# Patient Record
Sex: Male | Born: 1955 | Race: Black or African American | Hispanic: No | Marital: Single | Smoking: Former smoker
Health system: Southern US, Community
[De-identification: ages and names within clinical notes are randomized; demographics above are authoritative.]

## PROBLEM LIST (undated history)

## (undated) DIAGNOSIS — B182 Chronic viral hepatitis C: Secondary | ICD-10-CM

## (undated) DIAGNOSIS — I4891 Unspecified atrial fibrillation: Secondary | ICD-10-CM

## (undated) DIAGNOSIS — N183 Chronic kidney disease, stage 3 unspecified: Secondary | ICD-10-CM

## (undated) DIAGNOSIS — S065XAA Traumatic subdural hemorrhage with loss of consciousness status unknown, initial encounter: Secondary | ICD-10-CM

## (undated) DIAGNOSIS — I10 Essential (primary) hypertension: Secondary | ICD-10-CM

## (undated) DIAGNOSIS — S065X9A Traumatic subdural hemorrhage with loss of consciousness of unspecified duration, initial encounter: Secondary | ICD-10-CM

## (undated) DIAGNOSIS — E871 Hypo-osmolality and hyponatremia: Secondary | ICD-10-CM

## (undated) DIAGNOSIS — D649 Anemia, unspecified: Secondary | ICD-10-CM

## (undated) HISTORY — PX: NO PAST SURGERIES: SHX2092

---

## 2015-02-25 ENCOUNTER — Emergency Department (HOSPITAL_COMMUNITY)
Admission: EM | Admit: 2015-02-25 | Discharge: 2015-02-25 | Disposition: A | Discharge status: Home / Self Care | Payer: No Typology Code available for payment source | Source: Home / Self Care | Attending: Family Medicine | Admitting: Family Medicine

## 2015-02-25 ENCOUNTER — Encounter (HOSPITAL_COMMUNITY): Payer: Self-pay | Admitting: Emergency Medicine

## 2015-02-25 DIAGNOSIS — I160 Hypertensive urgency: Secondary | ICD-10-CM

## 2015-02-25 DIAGNOSIS — I10 Essential (primary) hypertension: Secondary | ICD-10-CM

## 2015-02-25 DIAGNOSIS — S46011A Strain of muscle(s) and tendon(s) of the rotator cuff of right shoulder, initial encounter: Secondary | ICD-10-CM

## 2015-02-25 LAB — POCT I-STAT, CHEM 8
BUN: 20 mg/dL (ref 6–23)
Calcium, Ion: 1.14 mmol/L (ref 1.13–1.30)
Chloride: 102 mmol/L (ref 96–112)
Creatinine, Ser: 1.1 mg/dL (ref 0.50–1.35)
Glucose, Bld: 96 mg/dL (ref 70–99)
HCT: 41 % (ref 39.0–52.0)
HEMOGLOBIN: 13.9 g/dL (ref 13.0–17.0)
Potassium: 4.5 mmol/L (ref 3.5–5.1)
Sodium: 140 mmol/L (ref 135–145)
TCO2: 26 mmol/L (ref 0–100)

## 2015-02-25 MED ORDER — CLONIDINE HCL 0.1 MG PO TABS
0.2000 mg | ORAL_TABLET | Freq: Once | ORAL | Status: AC
  Administered 2015-02-25: 0.2 mg via ORAL

## 2015-02-25 MED ORDER — CLONIDINE HCL 0.1 MG PO TABS
ORAL_TABLET | ORAL | Status: AC
  Filled 2015-02-25: qty 2

## 2015-02-25 MED ORDER — DICLOFENAC SODIUM 75 MG PO TBEC: 75.0000 mg | DELAYED_RELEASE_TABLET | Freq: Two times a day (BID) | ORAL | Status: DC

## 2015-02-25 MED ORDER — HYDROCHLOROTHIAZIDE 25 MG PO TABS: 25.0000 mg | ORAL_TABLET | Freq: Every day | ORAL | Status: DC

## 2015-02-25 MED ORDER — KETOROLAC TROMETHAMINE 60 MG/2ML IM SOLN
60.0000 mg | Freq: Once | INTRAMUSCULAR | Status: AC
  Administered 2015-02-25: 60 mg via INTRAMUSCULAR

## 2015-02-25 MED ORDER — KETOROLAC TROMETHAMINE 60 MG/2ML IM SOLN
INTRAMUSCULAR | Status: AC
  Filled 2015-02-25: qty 2

## 2015-02-25 NOTE — ED Notes (Addendum)
Right shoulder pain, no known injury.  Points to top/back of shoulder.  Pain with movement, worse with certain movements patient is right handed pain started in February.  Patient has used ice, heat and bengay

## 2015-02-25 NOTE — Discharge Instructions (Signed)
Your blood pressure is very elevated. Please start the new blood pressure medication and check your blood pressure daily. If you develop symptoms of low blood pressure including dizziness, lightheadedness, passing out please cut the medication in half or stop the medication completely. Please use the Voltaren twice daily for shoulder pain and do your shoulder exercises. Please return in 2 weeks for blood work and further management of your shoulder pain if not improving. These [facility] as you'll need to follow-up with them long-term. Please go to the emergency room if you have any further problems.   Shoulder Exercises EXERCISES  RANGE OF MOTION (ROM) AND STRETCHING EXERCISES These exercises may help you when beginning to rehabilitate your injury. Your symptoms may resolve with or without further involvement from your physician, physical therapist or athletic trainer. While completing these exercises, remember:   Restoring tissue flexibility helps normal motion to return to the joints. This allows healthier, less painful movement and activity.  An effective stretch should be held for at least 30 seconds.  A stretch should never be painful. You should only feel a gentle lengthening or release in the stretched tissue. ROM - Pendulum  Bend at the waist so that your right / left arm falls away from your body. Support yourself with your opposite hand on a solid surface, such as a table or a countertop.  Your right / left arm should be perpendicular to the ground. If it is not perpendicular, you need to lean over farther. Relax the muscles in your right / left arm and shoulder as much as possible.  Gently sway your hips and trunk so they move your right / left arm without any use of your right / left shoulder muscles.  Progress your movements so that your right / left arm moves side to side, then forward and backward, and finally, both clockwise  and counterclockwise.  Complete __________ repetitions in each direction. Many people use this exercise to relieve discomfort in their shoulder as well as to gain range of motion. Repeat __________ times. Complete this exercise __________ times per day. STRETCH - Flexion, Standing  Stand with good posture. With an underhand grip on your right / left hand and an overhand grip on the opposite hand, grasp a broomstick or cane so that your hands are a little more than shoulder-width apart.  Keeping your right / left elbow straight and shoulder muscles relaxed, push the stick with your opposite hand to raise your right / left arm in front of your body and then overhead. Raise your arm until you feel a stretch in your right / left shoulder, but before you have increased shoulder pain.  Try to avoid shrugging your right / left shoulder as your arm rises by keeping your shoulder blade tucked down and toward your mid-back spine. Hold __________ seconds.  Slowly return to the starting position. Repeat __________ times. Complete this exercise __________ times per day. STRETCH - Internal Rotation  Place your right / left hand behind your back, palm-up.  Throw a towel or belt over your opposite shoulder. Grasp the towel/belt with your right / left hand.  While keeping an upright posture, gently pull up on the towel/belt until you feel a stretch in the front of your right / left shoulder.  Avoid shrugging your right / left shoulder as your arm rises by keeping your shoulder blade tucked down and toward your mid-back spine.  Hold __________. Release  the stretch by lowering your opposite hand. Repeat __________ times. Complete this exercise __________ times per day. STRETCH - External Rotation and Abduction  Stagger your stance through a doorframe. It does not matter which foot is forward.  As instructed by your physician, physical therapist or athletic trainer, place your hands:  And forearms above  your head and on the door frame.  And forearms at head-height and on the door frame.  At elbow-height and on the door frame.  Keeping your head and chest upright and your stomach muscles tight to prevent over-extending your low-back, slowly shift your weight onto your front foot until you feel a stretch across your chest and/or in the front of your shoulders.  Hold __________ seconds. Shift your weight to your back foot to release the stretch. Repeat __________ times. Complete this stretch __________ times per day.  STRENGTHENING EXERCISES  These exercises may help you when beginning to rehabilitate your injury. They may resolve your symptoms with or without further involvement from your physician, physical therapist or athletic trainer. While completing these exercises, remember:   Muscles can gain both the endurance and the strength needed for everyday activities through controlled exercises.  Complete these exercises as instructed by your physician, physical therapist or athletic trainer. Progress the resistance and repetitions only as guided.  You may experience muscle soreness or fatigue, but the pain or discomfort you are trying to eliminate should never worsen during these exercises. If this pain does worsen, stop and make certain you are following the directions exactly. If the pain is still present after adjustments, discontinue the exercise until you can discuss the trouble with your clinician.  If advised by your physician, during your recovery, avoid activity or exercises which involve actions that place your right / left hand or elbow above your head or behind your back or head. These positions stress the tissues which are trying to heal. STRENGTH - Scapular Depression and Adduction  With good posture, sit on a firm chair. Supported your arms in front of you with pillows, arm rests or a table top. Have your elbows in line with the sides of your body.  Gently draw your shoulder  blades down and toward your mid-back spine. Gradually increase the tension without tensing the muscles along the top of your shoulders and the back of your neck.  Hold for __________ seconds. Slowly release the tension and relax your muscles completely before completing the next repetition.  After you have practiced this exercise, remove the arm support and complete it in standing as well as sitting. Repeat __________ times. Complete this exercise __________ times per day.  STRENGTH - External Rotators  Secure a rubber exercise band/tubing to a fixed object so that it is at the same height as your right / left elbow when you are standing or sitting on a firm surface.  Stand or sit so that the secured exercise band/tubing is at your side that is not injured.  Bend your elbow 90 degrees. Place a folded towel or small pillow under your right / left arm so that your elbow is a few inches away from your side.  Keeping the tension on the exercise band/tubing, pull it away from your body, as if pivoting on your elbow. Be sure to keep your body steady so that the movement is only coming from your shoulder rotating.  Hold __________ seconds. Release the tension in a controlled manner as you return to the starting position. Repeat __________ times. Complete this  exercise __________ times per day.  STRENGTH - Supraspinatus  Stand or sit with good posture. Grasp a __________ weight or an exercise band/tubing so that your hand is "thumbs-up," like when you shake hands.  Slowly lift your right / left hand from your thigh into the air, traveling about 30 degrees from straight out at your side. Lift your hand to shoulder height or as far as you can without increasing any shoulder pain. Initially, many people do not lift their hands above shoulder height.  Avoid shrugging your right / left shoulder as your arm rises by keeping your shoulder blade tucked down and toward your mid-back spine.  Hold for  __________ seconds. Control the descent of your hand as you slowly return to your starting position. Repeat __________ times. Complete this exercise __________ times per day.  STRENGTH - Shoulder Extensors  Secure a rubber exercise band/tubing so that it is at the height of your shoulders when you are either standing or sitting on a firm arm-less chair.  With a thumbs-up grip, grasp an end of the band/tubing in each hand. Straighten your elbows and lift your hands straight in front of you at shoulder height. Step back away from the secured end of band/tubing until it becomes tense.  Squeezing your shoulder blades together, pull your hands down to the sides of your thighs. Do not allow your hands to go behind you.  Hold for __________ seconds. Slowly ease the tension on the band/tubing as you reverse the directions and return to the starting position. Repeat __________ times. Complete this exercise __________ times per day.  STRENGTH - Scapular Retractors  Secure a rubber exercise band/tubing so that it is at the height of your shoulders when you are either standing or sitting on a firm arm-less chair.  With a palm-down grip, grasp an end of the band/tubing in each hand. Straighten your elbows and lift your hands straight in front of you at shoulder height. Step back away from the secured end of band/tubing until it becomes tense.  Squeezing your shoulder blades together, draw your elbows back as you bend them. Keep your upper arm lifted away from your body throughout the exercise.  Hold __________ seconds. Slowly ease the tension on the band/tubing as you reverse the directions and return to the starting position. Repeat __________ times. Complete this exercise __________ times per day. STRENGTH - Scapular Depressors  Find a sturdy chair without wheels, such as a from a dining room table.  Keeping your feet on the floor, lift your bottom from the seat and lock your elbows.  Keeping your  elbows straight, allow gravity to pull your body weight down. Your shoulders will rise toward your ears.  Raise your body against gravity by drawing your shoulder blades down your back, shortening the distance between your shoulders and ears. Although your feet should always maintain contact with the floor, your feet should progressively support less body weight as you get stronger.  Hold __________ seconds. In a controlled and slow manner, lower your body weight to begin the next repetition. Repeat __________ times. Complete this exercise __________ times per day.  Document Released: 10/11/2005 Document Revised: 02/19/2012 Document Reviewed: 03/11/2009 [facility] Patient Information 2015 [place], [place]. This information is not intended to replace advice given to you by your health care provider. Make sure you discuss any questions you have with your health care provider.

## 2015-02-25 NOTE — ED Provider Notes (Signed)
CSN: [id number]     Arrival date & time 02/25/15  1208 History   First MD Initiated Contact with Patient 02/25/15 1329     Chief Complaint  Patient presents with  . Shoulder Pain   (Consider location/radiation/quality/duration/timing/severity/associated sxs/prior Treatment) HPI  Pt has not been to the doctor in 4 years.   R should pain. Started 3 weeks ago. Icy hot w/o benefit. Woke up one morning in pain. Increased physical demand the day prior. Has had shoulder pain before. Worse w/ certain movements above the head. No acute injury. Slightly improving. Pain is constant.   History reviewed. No pertinent past medical history. History reviewed. No pertinent past surgical history. Family History  Problem Relation Age of Onset  . Cancer Mother   . Heart failure Father   . Diabetes Brother    History  Substance Use Topics  . Smoking status: Current Every Day Smoker -- 1.00 packs/day  . Smokeless tobacco: Not on file  . Alcohol Use: Yes    Review of Systems Per HPI with all other pertinent systems negative.   Allergies  Review of patient's allergies indicates no known allergies.  Home Medications   Prior to Admission medications   Medication Sig Start Date End Date Taking? Authorizing Provider  acetaminophen (TYLENOL) 325 MG tablet Take 650 mg by mouth every 6 (six) hours as needed.   Yes Historical Provider, MD  Menthol, Topical Analgesic, (BENGAY EX) Apply topically.   Yes Historical Provider, MD  diclofenac (VOLTAREN) 75 MG EC tablet Take 1 tablet (75 mg total) by mouth 2 (two) times daily. 02/25/15   Ozella Rocksavid J Lamoyne Palencia, MD  hydrochlorothiazide (HYDRODIURIL) 25 MG tablet Take 1 tablet (25 mg total) by mouth daily. 02/25/15   Ozella Rocksavid J Johnmark Geiger, MD   BP 210/115 mmHg  Pulse 110  Temp(Src) 98.6 F (37 C) (Oral)  Resp 16  SpO2 98% Physical Exam Physical Exam  Constitutional: oriented to person, place, and time. appears well-developed and well-nourished. No distress.  HENT:   Head: Normocephalic and atraumatic.  Eyes: EOMI. PERRL.  Neck: Normal range of motion.  Cardiovascular: RRR, no m/r/g, 2+ distal pulses,  Pulmonary/Chest: Effort normal and breath sounds normal. No respiratory distress.  Abdominal: Soft. Bowel sounds are normal. NonTTP, no distension.  Musculoskeletal: Left arm full range of motion, nontender to palpation, right shoulder with abduction limited to 100, and some limitation to external rotation. Hawkins positive on right, empty can normal by the back liftoff normal, patient able to place hand behind head with great difficulty and pain., Cross body with pain. No bony abnormality or effusion noted.  Neurological: alert and oriented to person, place, and time.  Skin: Skin is warm. No rash noted. non diaphoretic.  Psychiatric: normal mood and affect. behavior is normal. Judgment and thought content normal.   ED Course  Procedures (including critical care time) Labs Review Labs Reviewed  HEMOGLOBIN A1C  POCT I-STAT, CHEM 8    Imaging Review No results found.   MDM   1. Hypertensive urgency   2. Rotator cuff strain, right, initial encounter      Chem 8 showing normal renal function and metabolic panel. Hemoglobin normal.  Clonidine 0.2 mg oral Blood much improved  Toradol 60 mg IM given  Of note patient has just obtained insurance and [facility]. He was instructed by the health and one in the center to come see us until he is established care in their  clinic. Since the patient has significant medical conditions with which cannot wait for treatment until then we will treat his blood pressure and shoulder pain until that time.  Start HCTZ 25 mg. Discussed signs and symptoms of hypotension. Patient to decrease dose or stop medication completely developed symptoms of hypotension.  Voltaren and shoulder exercises. If no improvement in 2-4 weeks consider imaging and/or a steroid  injection.  Precautions given and all questions answered  Shelly Flatten, MD Family Medicine 02/25/2015, 2:31 PM    Ozella Rocks, MD 02/25/15 [phone]

## 2015-02-26 LAB — HEMOGLOBIN A1C
Hgb A1c MFr Bld: 5.6 % (ref 4.8–5.6)
Mean Plasma Glucose: 114 mg/dL

## 2015-03-12 ENCOUNTER — Encounter (HOSPITAL_COMMUNITY): Payer: Self-pay

## 2015-03-12 ENCOUNTER — Emergency Department (HOSPITAL_COMMUNITY)
Admission: EM | Admit: 2015-03-12 | Discharge: 2015-03-12 | Disposition: A | Discharge status: Home / Self Care | Payer: No Typology Code available for payment source | Source: Home / Self Care | Attending: Family Medicine | Admitting: Family Medicine

## 2015-03-12 ENCOUNTER — Emergency Department (INDEPENDENT_AMBULATORY_CARE_PROVIDER_SITE_OTHER): Payer: No Typology Code available for payment source

## 2015-03-12 DIAGNOSIS — M25511 Pain in right shoulder: Secondary | ICD-10-CM

## 2015-03-12 DIAGNOSIS — I10 Essential (primary) hypertension: Secondary | ICD-10-CM

## 2015-03-12 DIAGNOSIS — M25519 Pain in unspecified shoulder: Secondary | ICD-10-CM

## 2015-03-12 LAB — POCT I-STAT, CHEM 8
BUN: 17 mg/dL (ref 6–23)
CHLORIDE: 99 mmol/L (ref 96–112)
CREATININE: 1.1 mg/dL (ref 0.50–1.35)
Calcium, Ion: 1.16 mmol/L (ref 1.13–1.30)
GLUCOSE: 105 mg/dL — AB (ref 70–99)
HCT: 42 % (ref 39.0–52.0)
Hemoglobin: 14.3 g/dL (ref 13.0–17.0)
Potassium: 3.8 mmol/L (ref 3.5–5.1)
SODIUM: 142 mmol/L (ref 135–145)
TCO2: 27 mmol/L (ref 0–100)

## 2015-03-12 MED ORDER — METHYLPREDNISOLONE ACETATE 40 MG/ML IJ SUSP
40.0000 mg | Freq: Once | INTRAMUSCULAR | Status: AC
  Administered 2015-03-12: 40 mg via INTRA_ARTICULAR

## 2015-03-12 MED ORDER — METHYLPREDNISOLONE ACETATE 40 MG/ML IJ SUSP
INTRAMUSCULAR | Status: AC
  Filled 2015-03-12: qty 1

## 2015-03-12 MED ORDER — HYDROCHLOROTHIAZIDE 25 MG PO TABS: 25.0000 mg | ORAL_TABLET | Freq: Every day | ORAL | Status: DC

## 2015-03-12 MED ORDER — DICLOFENAC SODIUM 75 MG PO TBEC: 75.0000 mg | DELAYED_RELEASE_TABLET | Freq: Two times a day (BID) | ORAL | Status: DC

## 2015-03-12 MED ORDER — LIDOCAINE HCL (PF) 2 % IJ SOLN
INTRAMUSCULAR | Status: AC
  Filled 2015-03-12: qty 2

## 2015-03-12 NOTE — ED Notes (Signed)
C/o continues to have painin shoulder, BP still up.out of BP pills

## 2015-03-12 NOTE — Discharge Instructions (Signed)
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 The steroid injection should work well to improve this pain. Please continue your exercises. Please call and set up a follow up appointment at the sports medicine clinic in 4 weeks if not better.  Please take the blood pressure medication as prescribed. Please keep your appointment at the new PCPs office in May.

## 2015-03-12 NOTE — ED Provider Notes (Signed)
CSN: [id number]     Arrival date & time 03/12/15  0801 History   First MD Initiated Contact with Patient 03/12/15 0813     Chief Complaint  Patient presents with  . Hypertension  . Shoulder Pain   (Consider location/radiation/quality/duration/timing/severity/associated sxs/prior Treatment) HPI  [facility].   Shoulder pain: Initially patient started on the Voltaren and shoulder exercises which he denies prescribed. States that the shoulder pain started to become somewhat improved but slept on his right side which worsened his shoulder pain and has not been able to get a better sense. Pain with certain movements. Pain is constant. Some improvement with the Voltaren and the exercises. I see how without relief. All this started actually 5 weeks ago during an increase in the patient's physical demand on his arms. Denies radiation of pain, decreased sensation, decreased strength  Hypertension. Patient reports actively spilling out his medicine. Has not had any blood pressure medicine since yesterday. Denies chest pain, palpitations, shortness of breath, headaches.  History reviewed. No pertinent past medical history. History reviewed. No pertinent past surgical history. Family History  Problem Relation Age of Onset  . Cancer Mother   . Heart failure Father   . Diabetes Brother    History  Substance Use Topics  . Smoking status: Current Every Day Smoker -- 1.00 packs/day  . Smokeless tobacco: Not on file  . Alcohol Use: Yes    Review of Systems Per HPI with all other pertinent systems negative.   Allergies  Review of patient's allergies indicates no known allergies.  Home Medications   Prior to Admission medications   Medication Sig Start Date End Date Taking? Authorizing Provider  acetaminophen (TYLENOL) 325 MG tablet Take 650 mg by mouth every 6 (six) hours as needed.    Historical Provider, MD  diclofenac (VOLTAREN) 75 MG EC tablet Take 1  tablet (75 mg total) by mouth 2 (two) times daily. 03/12/15   Ozella Rocksavid J Zayanna Pundt, MD  hydrochlorothiazide (HYDRODIURIL) 25 MG tablet Take 1 tablet (25 mg total) by mouth daily. 03/12/15   Ozella Rocksavid J Duron Meister, MD  Menthol, Topical Analgesic, (BENGAY EX) Apply topically.    Historical Provider, MD   BP 172/92 mmHg  Pulse 106  Temp(Src) 98.2 F (36.8 C) (Oral)  Resp 16  SpO2 98% Physical Exam Physical Exam  Constitutional: oriented to person, place, and time. appears well-developed and well-nourished. No distress.  HENT:  Head: Normocephalic and atraumatic.  Eyes: EOMI. PERRL.  Neck: Normal range of motion.  Cardiovascular: RRR, no m/r/g, 2+ distal pulses,  Pulmonary/Chest: Effort normal and breath sounds normal. No respiratory distress.  Abdominal: Soft. Bowel sounds are normal. NonTTP, no distension.  Musculoskeletal: Left arm full range of motion, nontender to palpation. Right shoulder with abduction limited to 100, and some limitation to external rotation. Hawkins positive on right, empty can normal, liftoff normal, patient able to place hand behind head with some difficulty., Cross body with pain. No bony abnormality or effusion noted.  Neurological: alert and oriented to person, place, and time.  Neurological: alert and oriented to person, place, and time.  Skin: Skin is warm. No rash noted. non diaphoretic.  Psychiatric: normal mood and affect. behavior is normal. Judgment and thought content normal.   ED Course  Injection of joint Date/Time: 03/12/2015 8:53 AM Performed by: Konrad DoloresMERRELL, Ysabel Cowgill J Authorized by: Konrad DoloresMERRELL, Zayveon Raschke J Consent: Verbal consent obtained. Consent given by: patient Patient identity confirmed: verbally with patient Preparation:  Patient was prepped and draped in the usual sterile fashion. Local anesthesia used: yes Local anesthetic: lidocaine 2% without epinephrine Anesthetic total: 3 ml Patient sedated: no Patient tolerance: Patient tolerated the procedure well with no  immediate complications Comments:  Depomedrol used. After using ethyl chloride spray to provide superficial anesthesia a posterior approach was used to obtain access to the glenohumeral space and the medication was injected.    (including critical care time) Labs Review Labs Reviewed  POCT I-STAT, CHEM 8 - Abnormal; Notable for the following:    Glucose, Bld 105 (*)    All other components within normal limits    Imaging Review Dg Shoulder Right  03/12/2015   CLINICAL DATA:  Right shoulder pain since February 2016. No known injury.  EXAM: RIGHT SHOULDER - 2+ VIEW  COMPARISON:  None.  FINDINGS: There is no evidence of fracture or dislocation. There is no evidence of arthropathy or other focal bone abnormality. Soft tissues are unremarkable.  Multiple small blebs in the right lung apex.  IMPRESSION: Normal right shoulder.   Electronically Signed   By: Francene Boyers M.D.   On: 03/12/2015 08:39     MDM   1. Essential hypertension   2. Shoulder pain      [place] pain: xray showing normal shoulder without significant arthritis, or acute abnormality. Anticipate his pain is in part from rotator cuff tendinitis, bursitis, and potentially mild arthritis.. Minimal improvement on Voltaren and exercises with acute worsening again after stressing it. Patient given 40 mg Depo-Medrol 2 mL 1% lidocaine injection. Initially improved. Patient to continue exercises and to follow-up at sports medicine clinic if not improved. [place] pain improved w/in minutes of injection.   Patient hypertensive today on exam but much improved over previous visit to the urgent care. Will refill his hydrochlorothiazide but counseled patient that he must see his primary care physician. He has an appointment set for May 25 to establish care with a new PCP. We will provide her with enough medication to get him to that appointment. Chem-8 today shows normal chemistry and renal function.     Ozella Rocks, MD 03/12/15  [phone]

## 2017-05-24 ENCOUNTER — Encounter (HOSPITAL_COMMUNITY): Payer: Self-pay | Admitting: Emergency Medicine

## 2017-05-24 ENCOUNTER — Inpatient Hospital Stay (HOSPITAL_COMMUNITY): Payer: Self-pay

## 2017-05-24 ENCOUNTER — Inpatient Hospital Stay (HOSPITAL_COMMUNITY)
Admission: EM | Admit: 2017-05-24 | Discharge: 2017-05-25 | DRG: 064 | Disposition: A | Discharge status: Home / Self Care | Payer: Self-pay | Attending: Internal Medicine | Admitting: Internal Medicine

## 2017-05-24 ENCOUNTER — Other Ambulatory Visit (HOSPITAL_COMMUNITY): Payer: Self-pay

## 2017-05-24 ENCOUNTER — Emergency Department (HOSPITAL_COMMUNITY): Payer: Self-pay

## 2017-05-24 DIAGNOSIS — Z681 Body mass index (BMI) 19 or less, adult: Secondary | ICD-10-CM

## 2017-05-24 DIAGNOSIS — T148XXA Other injury of unspecified body region, initial encounter: Secondary | ICD-10-CM

## 2017-05-24 DIAGNOSIS — R402252 Coma scale, best verbal response, oriented, at arrival to emergency department: Secondary | ICD-10-CM | POA: Diagnosis present

## 2017-05-24 DIAGNOSIS — R402142 Coma scale, eyes open, spontaneous, at arrival to emergency department: Secondary | ICD-10-CM | POA: Diagnosis present

## 2017-05-24 DIAGNOSIS — Z8249 Family history of ischemic heart disease and other diseases of the circulatory system: Secondary | ICD-10-CM

## 2017-05-24 DIAGNOSIS — I629 Nontraumatic intracranial hemorrhage, unspecified: Principal | ICD-10-CM

## 2017-05-24 DIAGNOSIS — S0689AA Other specified intracranial injury with loss of consciousness status unknown, initial encounter: Secondary | ICD-10-CM

## 2017-05-24 DIAGNOSIS — I480 Paroxysmal atrial fibrillation: Secondary | ICD-10-CM | POA: Diagnosis present

## 2017-05-24 DIAGNOSIS — Z833 Family history of diabetes mellitus: Secondary | ICD-10-CM

## 2017-05-24 DIAGNOSIS — J9601 Acute respiratory failure with hypoxia: Secondary | ICD-10-CM | POA: Diagnosis present

## 2017-05-24 DIAGNOSIS — D649 Anemia, unspecified: Secondary | ICD-10-CM | POA: Diagnosis present

## 2017-05-24 DIAGNOSIS — E871 Hypo-osmolality and hyponatremia: Secondary | ICD-10-CM | POA: Diagnosis present

## 2017-05-24 DIAGNOSIS — F17210 Nicotine dependence, cigarettes, uncomplicated: Secondary | ICD-10-CM | POA: Diagnosis present

## 2017-05-24 DIAGNOSIS — S06369A Traumatic hemorrhage of cerebrum, unspecified, with loss of consciousness of unspecified duration, initial encounter: Secondary | ICD-10-CM

## 2017-05-24 DIAGNOSIS — I10 Essential (primary) hypertension: Secondary | ICD-10-CM | POA: Diagnosis present

## 2017-05-24 DIAGNOSIS — E46 Unspecified protein-calorie malnutrition: Secondary | ICD-10-CM | POA: Diagnosis present

## 2017-05-24 DIAGNOSIS — R001 Bradycardia, unspecified: Secondary | ICD-10-CM | POA: Diagnosis present

## 2017-05-24 DIAGNOSIS — B1920 Unspecified viral hepatitis C without hepatic coma: Secondary | ICD-10-CM | POA: Diagnosis present

## 2017-05-24 DIAGNOSIS — W19XXXA Unspecified fall, initial encounter: Secondary | ICD-10-CM

## 2017-05-24 DIAGNOSIS — R7989 Other specified abnormal findings of blood chemistry: Secondary | ICD-10-CM

## 2017-05-24 DIAGNOSIS — N179 Acute kidney failure, unspecified: Secondary | ICD-10-CM | POA: Diagnosis present

## 2017-05-24 DIAGNOSIS — B182 Chronic viral hepatitis C: Secondary | ICD-10-CM | POA: Diagnosis present

## 2017-05-24 DIAGNOSIS — S06350A Traumatic hemorrhage of left cerebrum without loss of consciousness, initial encounter: Secondary | ICD-10-CM

## 2017-05-24 DIAGNOSIS — Z809 Family history of malignant neoplasm, unspecified: Secondary | ICD-10-CM

## 2017-05-24 DIAGNOSIS — I959 Hypotension, unspecified: Secondary | ICD-10-CM | POA: Diagnosis present

## 2017-05-24 DIAGNOSIS — R402362 Coma scale, best motor response, obeys commands, at arrival to emergency department: Secondary | ICD-10-CM | POA: Diagnosis present

## 2017-05-24 HISTORY — DX: Unspecified atrial fibrillation: I48.91

## 2017-05-24 HISTORY — DX: Essential (primary) hypertension: I10

## 2017-05-24 LAB — BASIC METABOLIC PANEL
ANION GAP: 14 (ref 5–15)
ANION GAP: 9 (ref 5–15)
BUN: 15 mg/dL (ref 6–20)
BUN: 17 mg/dL (ref 6–20)
CHLORIDE: 97 mmol/L — AB (ref 101–111)
CHLORIDE: 102 mmol/L (ref 101–111)
CO2: 16 mmol/L — ABNORMAL LOW (ref 22–32)
CO2: 21 mmol/L — AB (ref 22–32)
Calcium: 8.1 mg/dL — ABNORMAL LOW (ref 8.9–10.3)
Calcium: 8.6 mg/dL — ABNORMAL LOW (ref 8.9–10.3)
Creatinine, Ser: 2.41 mg/dL — ABNORMAL HIGH (ref 0.61–1.24)
Creatinine, Ser: 1.93 mg/dL — ABNORMAL HIGH (ref 0.61–1.24)
GFR calc Af Amer: 31 mL/min — ABNORMAL LOW (ref >60)
GFR calc Af Amer: 41 mL/min — ABNORMAL LOW (ref >60)
GFR, EST NON AFRICAN AMERICAN: 27 mL/min — AB (ref >60)
GFR, EST NON AFRICAN AMERICAN: 36 mL/min — AB (ref >60)
GLUCOSE: 80 mg/dL (ref 65–99)
Glucose, Bld: 136 mg/dL — ABNORMAL HIGH (ref 65–99)
Potassium: 4.3 mmol/L (ref 3.5–5.1)
Potassium: 4.3 mmol/L (ref 3.5–5.1)
SODIUM: 127 mmol/L — AB (ref 135–145)
Sodium: 132 mmol/L — ABNORMAL LOW (ref 135–145)

## 2017-05-24 LAB — HEPATIC FUNCTION PANEL
ALBUMIN: 2.5 g/dL — AB (ref 3.5–5.0)
ALT: 45 U/L (ref 17–63)
AST: 195 U/L — AB (ref 15–41)
Alkaline Phosphatase: 104 U/L (ref 38–126)
BILIRUBIN DIRECT: 0.4 mg/dL (ref 0.1–0.5)
BILIRUBIN TOTAL: 1 mg/dL (ref 0.3–1.2)
Indirect Bilirubin: 0.6 mg/dL (ref 0.3–0.9)
Total Protein: 8.1 g/dL (ref 6.5–8.1)

## 2017-05-24 LAB — URINALYSIS, ROUTINE W REFLEX MICROSCOPIC
Bilirubin Urine: NEGATIVE
GLUCOSE, UA: NEGATIVE mg/dL
Ketones, ur: NEGATIVE mg/dL
Leukocytes, UA: NEGATIVE
NITRITE: NEGATIVE
Protein, ur: 100 mg/dL — AB
Specific Gravity, Urine: 1.009 (ref 1.005–1.030)
pH: 5 (ref 5.0–8.0)

## 2017-05-24 LAB — I-STAT TROPONIN, ED
TROPONIN I, POC: 0.09 ng/mL — AB (ref 0.00–0.08)
Troponin i, poc: 0.08 ng/mL (ref 0.00–0.08)

## 2017-05-24 LAB — LIPID PANEL
CHOL/HDL RATIO: 2.5 ratio
Cholesterol: 127 mg/dL (ref 0–200)
HDL: 50 mg/dL (ref >40)
LDL CALC: 63 mg/dL (ref 0–99)
Triglycerides: 69 mg/dL (ref <150)
VLDL: 14 mg/dL (ref 0–40)

## 2017-05-24 LAB — RAPID URINE DRUG SCREEN, HOSP PERFORMED
Amphetamines: NOT DETECTED
Barbiturates: NOT DETECTED
Benzodiazepines: NOT DETECTED
COCAINE: POSITIVE — AB
OPIATES: NOT DETECTED
TETRAHYDROCANNABINOL: NOT DETECTED

## 2017-05-24 LAB — CBC
HEMATOCRIT: 28.7 % — AB (ref 39.0–52.0)
Hemoglobin: 9.3 g/dL — ABNORMAL LOW (ref 13.0–17.0)
MCH: 28.4 pg (ref 26.0–34.0)
MCHC: 32.4 g/dL (ref 30.0–36.0)
MCV: 87.8 fL (ref 78.0–100.0)
Platelets: 337 10*3/uL (ref 150–400)
RBC: 3.27 MIL/uL — AB (ref 4.22–5.81)
RDW: 16.6 % — AB (ref 11.5–15.5)
WBC: 8.9 10*3/uL (ref 4.0–10.5)

## 2017-05-24 LAB — VITAMIN B12: Vitamin B-12: 242 pg/mL (ref 180–914)

## 2017-05-24 LAB — TROPONIN I
TROPONIN I: 0.08 ng/mL — AB (ref <0.03)
TROPONIN I: 0.09 ng/mL — AB (ref <0.03)
Troponin I: 0.09 ng/mL (ref <0.03)

## 2017-05-24 LAB — OSMOLALITY: Osmolality: 281 mOsm/kg (ref 275–295)

## 2017-05-24 LAB — RETICULOCYTES
RBC.: 3.68 MIL/uL — AB (ref 4.22–5.81)
RETIC COUNT ABSOLUTE: 69.9 10*3/uL (ref 19.0–186.0)
RETIC CT PCT: 1.9 % (ref 0.4–3.1)

## 2017-05-24 LAB — BETA-HYDROXYBUTYRIC ACID: BETA-HYDROXYBUTYRIC ACID: 0.62 mmol/L — AB (ref 0.05–0.27)

## 2017-05-24 LAB — FOLATE: Folate: 15.4 ng/mL (ref >5.9)

## 2017-05-24 LAB — LIPASE, BLOOD: Lipase: 29 U/L (ref 11–51)

## 2017-05-24 LAB — HIV ANTIBODY (ROUTINE TESTING W REFLEX): HIV Screen 4th Generation wRfx: NONREACTIVE

## 2017-05-24 LAB — PROTIME-INR
INR: 1.06
PROTHROMBIN TIME: 13.8 s (ref 11.4–15.2)

## 2017-05-24 LAB — FERRITIN: Ferritin: 609 ng/mL — ABNORMAL HIGH (ref 24–336)

## 2017-05-24 LAB — OSMOLALITY, URINE: OSMOLALITY UR: 206 mosm/kg — AB (ref 300–900)

## 2017-05-24 LAB — CK: CK TOTAL: 343 U/L (ref 49–397)

## 2017-05-24 LAB — LACTIC ACID, PLASMA: LACTIC ACID, VENOUS: 1.6 mmol/L (ref 0.5–1.9)

## 2017-05-24 LAB — PATHOLOGIST SMEAR REVIEW

## 2017-05-24 LAB — CBG MONITORING, ED: Glucose-Capillary: 86 mg/dL (ref 65–99)

## 2017-05-24 LAB — ETHANOL

## 2017-05-24 MED ORDER — ASPIRIN 81 MG PO CHEW: 81.0000 mg | CHEWABLE_TABLET | Freq: Once | ORAL | Status: DC

## 2017-05-24 MED ORDER — ONDANSETRON HCL 4 MG/2ML IJ SOLN
4.0000 mg | Freq: Once | INTRAMUSCULAR | Status: DC
  Filled 2017-05-24: qty 2

## 2017-05-24 MED ORDER — VITAMIN B-1 100 MG PO TABS
100.0000 mg | ORAL_TABLET | Freq: Every day | ORAL | Status: DC
  Administered 2017-05-24 – 2017-05-25 (×2): 100 mg via ORAL
  Filled 2017-05-24 (×2): qty 1

## 2017-05-24 MED ORDER — LEVETIRACETAM 500 MG PO TABS
500.0000 mg | ORAL_TABLET | Freq: Two times a day (BID) | ORAL | Status: DC
  Administered 2017-05-24 – 2017-05-25 (×3): 500 mg via ORAL
  Filled 2017-05-24 (×3): qty 1

## 2017-05-24 MED ORDER — METOPROLOL SUCCINATE ER 100 MG PO TB24: 100.0000 mg | ORAL_TABLET | Freq: Every day | ORAL | Status: DC

## 2017-05-24 MED ORDER — GADOBENATE DIMEGLUMINE 529 MG/ML IV SOLN
6.0000 mL | Freq: Once | INTRAVENOUS | Status: AC
  Administered 2017-05-24: 6 mL via INTRAVENOUS

## 2017-05-24 MED ORDER — SODIUM CHLORIDE 0.9 % IV SOLN: INTRAVENOUS | Status: AC

## 2017-05-24 MED ORDER — LORAZEPAM 1 MG PO TABS: 1.0000 mg | ORAL_TABLET | Freq: Four times a day (QID) | ORAL | Status: DC | PRN

## 2017-05-24 MED ORDER — SODIUM CHLORIDE 0.9 % IV BOLUS (SEPSIS)
500.0000 mL | Freq: Once | INTRAVENOUS | Status: AC
Start: 2017-05-24 — End: 2017-05-24
  Administered 2017-05-24: 500 mL via INTRAVENOUS

## 2017-05-24 MED ORDER — LORAZEPAM 2 MG/ML IJ SOLN: 1.0000 mg | Freq: Four times a day (QID) | INTRAMUSCULAR | Status: DC | PRN

## 2017-05-24 MED ORDER — SODIUM CHLORIDE 0.9 % IV BOLUS (SEPSIS): 500.0000 mL | Freq: Once | INTRAVENOUS | Status: DC

## 2017-05-24 MED ORDER — FOLIC ACID 1 MG PO TABS
1.0000 mg | ORAL_TABLET | Freq: Every day | ORAL | Status: DC
  Administered 2017-05-24 – 2017-05-25 (×2): 1 mg via ORAL
  Filled 2017-05-24 (×2): qty 1

## 2017-05-24 MED ORDER — ADULT MULTIVITAMIN W/MINERALS CH
1.0000 | ORAL_TABLET | Freq: Every day | ORAL | Status: DC
  Administered 2017-05-24 – 2017-05-25 (×2): 1 via ORAL
  Filled 2017-05-24 (×2): qty 1

## 2017-05-24 MED ORDER — THIAMINE HCL 100 MG/ML IJ SOLN: 100.0000 mg | Freq: Every day | INTRAMUSCULAR | Status: DC

## 2017-05-24 NOTE — ED Notes (Signed)
Patient transported to MRI 

## 2017-05-24 NOTE — ED Notes (Signed)
Report called  

## 2017-05-24 NOTE — H&P (Signed)
Date: 05/24/2017               Patient Name:  Joseph Nash MRN: [id number]  DOB: 1956/06/02 Age / Sex: 61 y.o., male   PCP: Patient, No Pcp Per         Medical Service: Internal Medicine Teaching Service         Attending Physician: Dr. Gust Rung, DO    First Contact: Dr. Obie Dredge Pager: [phone]  Second Contact: Dr. Earlene Plater Pager: [phone]       After Hours (After 5p/  First Contact Pager: [phone]  weekends / holidays): Second Contact Pager: [phone]   Chief Complaint: fall  History of Present Illness: Mr. Mccaskill is a 61 year old with paroxysmal atrial fibrillation and hypertension who presented to the ED earlier after having a fall. Around 2-3pm yesterday afternoon, he felt dizzy with walking to the point where he compared his gait staggering to that of a drunk. He went to his bed to rest for a few hours but got up around 7-8pm to vomit. Upon walking to the bathroom, he felt dizzy again, lost his balance, and fell backwards. He spent about 30 minutes on the ground before he could make his way back to the bed and call a woman who lived down the hallway. She recommended he proceed to the hospital for evaluation.  He denied any associated chest pain, difficulty breathing, palpitations, dizziness, changes in vision, weakness, numbness/tingling, loss of consciousness. He last saw Dr. Randa Evens, his PCP, two days ago and takes medication for hypertension and aspirin 81 mg for an irregular heart rhythm which he thinks was atrial fibrillation. His father died of a heart attack at age 32 though he has never had a heart attack. 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 He smokes 10 cigarettes and cannot recall for how long he has smoked, drinks 6 beers/day, and has used cocaine in the past.   Per EMS, he was found to be hypotensive with systolic BP in the 80s and bradycardic with HR in the 40s, so he was started on an epinephrine drip which was  discontinued upon arrival to the ED. Head CT revealed subcortical left frontal hematoma measuring 19 x 18 x 12 mm, and Neurosurgery was consulted for further evaluation.  Meds:  Triamterene/HCTZ 75/50 mg daily Aspirin 81 mg daily Dilitiazem XR 240 mg Metoprolol succinate 100 mg  Allergies: Allergies as of 05/24/2017  . (No Known Allergies)   Past Medical History:  Diagnosis Date  . Atrial fibrillation (HCC)   . Hypertension    Family History: As noted in the HPI.  Social History: As noted in the HPI.  Review of Systems: A complete ROS was negative except as per HPI.   Physical Exam: Blood pressure (!) 148/74, pulse 78, temperature (!) 95.9 F (35.5 C), temperature source Axillary, resp. rate (!) 22, SpO2 90 %. Physical Exam  Constitutional: He is oriented to person, place, and time. No distress.  Malnourished  HENT:  Head: Normocephalic and atraumatic.  Eyes: Conjunctivae are normal. Pupils are equal, round, and reactive to light. No scleral icterus.  Cardiovascular: Normal rate and normal heart sounds.   Pulmonary/Chest: Effort normal. No respiratory distress.  Abdominal: Soft. He exhibits no distension.  Neurological: He is alert and oriented to person, place, and time. Coordination normal.  Nystagmus noted with moving the eyes up and down as opposed to left and right.  Skin: Skin is warm and dry. He is not diaphoretic.   EKG: No prior to compare. Significant motion artifact limits interpretation though appears sinus rhythm with possible LVH.   CXR: No prior to compare. Hazy opacity noted in the right lower lobe with some cardiomegaly. Low lung volumes.   Assessment & Plan by Problem: Principal Problem:   Intracranial hematoma (HCC) Active Problems:   Paroxysmal atrial fibrillation (HCC)   Elevated serum creatinine   Hyponatremia   Hypertension  Mr. Kassebaum is a 61 year old with paroxysmal atrial fibrillation and hypertension hospitalized for intracranial  hematoma and acute hypoxic respiratory failure found to have elevated creatine with both anion gap and non-anion gap metabolic acidoses, hyponatremia, transaminitis, normocytic anemia.  Intracranial hematoma: Unclear trigger though hypertension and chronic alcohol use are risk factors. No history of blood thinners that he can recall. No syncope. Given his malnourished appearance, malignancy is suspect and could account for his hyponatremia, abnormal CXR. -Order brain MRI with contrast to better characterize lesion -Defer DVT prophylaxis -Checking UDS given remote history of cocaine use -Check PT/INR -Follow-up Neurosurgery recommendations  Hyponatremia: Unclear if acute or chronic given no prior labwork. He has received 500 cc of normal saline thus which could confound the picture.  -Check serum and urine osmolality before giving additional IV fluids -Recheck BMET at 8 hours after his first lab draw -Holding diuretics for now  Acute kidney injury with both anion gap and non-anion gap metabolic acidoses: Change in gap is 4 as compared to Last creatinine was 1.1 at 2 years ago though suspect he has some degree of chronic renal insufficiency given hypertension which would account for non-anion gap metabolic acidosis. Unclear source of anion gap.  -Check renal ultrasound, urinalysis -Checking osmolality as noted above and serum ethanol to calculate osmolal gap -Consider ACE/ARB on discharge if he has proteinuria  Acute hypoxic respiratory failure: Less likely infection given no fever and leukocytosis though he is hypoxic to low 90s and upper 80s on room air though he feels he developed cough upon arrival to the ED. Suspect he has some degree of chronic lung disease with tobacco use. Low pre-test probability for PE at this point [Well's score 0]. -Continue supplemental oxygen -Consider chest CT if brain MRI findings suggest occult malignancy -Trending troponins  Paroxysmal atrial fibrillation:  CHADSVASC2 score 1 for hypertension though unclear of his EF. He likely has vascular disease given ongoing tobacco use and carotid atherosclerosis noted on CT imaging which would increase his score to at least 2.   -Hold aspirin 81 mg until stability of hematoma is noted on repeat neuroimaging -Defer anticoagulation as noted above -Monitor on telemetry and repeat EKG  Transaminitis: AST 195 compared to ALT 45 which could suggest CK elevation from rhabdomyolysis from lying on the ground.  -Check CK  Normocytic anemia: Hb 9.3 on admission with MCV 87, down from 14.3 at 2 years ago.  -Check retic count, smear to classify as hypoproliferative versus hemolyis and possibly B12/folate, iron studies, ferritin if it's the former  Hypertension: Holding diuretics as noted above. -Holding both nodal agents given his dizziness, especially the beta-blocker given possible cocaine use -Treat with hydralazine 5 mg as needed if BP>180/100  #FEN:  -Diet: Regular  #DVT prophylaxis: SCD  #CODE STATUS: FULL CODE -Defer to brother Evaristo Bury [phone] if patients lacks decision-making capacity -Confirmed with patient on admission  Dispo: Admit patient to Inpatient with expected length of stay greater than 2 midnights.  Signed: Beather Arbour, MD  05/24/2017, 8:25 AM  Pager: [phone]

## 2017-05-24 NOTE — ED Notes (Signed)
Epi drip stopped per verbal order from MD

## 2017-05-24 NOTE — ED Notes (Signed)
Pt in x-ray at this time

## 2017-05-24 NOTE — Progress Notes (Signed)
MRI confirms that lesion is consistent with intra-axial hemorrhage. No acute neurosurgical intervention. Will repeat CT head tomorrow am Continue to monitor neuro exam Will repeat CT sooner as indicated.

## 2017-05-24 NOTE — ED Triage Notes (Signed)
Per [facility]  Syncopal episode unwitnessed. Pt states he felt real dizzy.Pt does not remember waking up on the floor. Pt went to the bathroom and made his way to the bed. Pt pulse was 40 and BP systolic 86-88 palpated. Pt does not have strong radial pulses. Pt has  Had approx 600 ccs of fluid /min of epi.

## 2017-05-24 NOTE — ED Notes (Signed)
Pt denies LOC. PT admits to falling in the bathroom and being unable to get up. Called to his neighbor down the hall. Pt went the the bathroom to throw up and fell.

## 2017-05-24 NOTE — ED Notes (Signed)
Pt asked to give urine sample said he could not at this time. Pt advised that when he felt he could go to use his call bell.

## 2017-05-24 NOTE — ED Notes (Signed)
Returned from CT scan.

## 2017-05-24 NOTE — ED Provider Notes (Addendum)
[facility]-EMERGENCY DEPT Provider Note   CSN: [id number] Arrival date & time: 05/24/17  0348     History   Chief Complaint Chief Complaint  Patient presents with  . Fall  . Bradycardia    HPI Joseph Nash is a 61 y.o. male.  HPI   Began to have some nausea yesterday evening, thought would sleep it off but woke up around 330AM, went to go vomit but fell down. Was vomiting but lost balance and fell in the bathroom. Felt dizzy. Hit back of head. No headache. No LOC per patient. No fevers/chest pain/shortness of breath/numbness/weakness.  No recent change in medication. No blood thinners.  No urinary symptoms. Has not felt this way before, Does note feeling off balance walking down hallway last night in the early evening but no other neurologic symptoms.  Reports that after he fell, he initially had difficulty getting up, however he was eventually able to get up, go to his bed, and called EMS.  Cough/congestion just started right now.   When EMS responded to the call, they report that his pulse was in the 40s, and blood pressures were in the 80s, and he was started on IV fluids and 4 mics per minute of epinephrine.  Pt reports he sees PCP at [facility]  Past Medical History:  Diagnosis Date  . Atrial fibrillation (HCC)   . Hypertension     Patient Active Problem List   Diagnosis Date Noted  . Paroxysmal atrial fibrillation (HCC) 05/24/2017  . Acute kidney injury (HCC) 05/24/2017  . Hyponatremia 05/24/2017  . Hypertension 05/24/2017  . Intracranial hematoma (HCC) 05/24/2017    History reviewed. No pertinent surgical history.     Home Medications    Prior to Admission medications   Medication Sig Start Date End Date Taking? Authorizing Provider  acetaminophen (TYLENOL) 325 MG tablet Take 650 mg by mouth every 6 (six) hours as needed.    [provider]  diclofenac (VOLTAREN) 75 MG EC tablet Take 1 tablet (75 mg total) by mouth 2 (two) times daily. 03/12/15    Ozella Rocks, MD  hydrochlorothiazide (HYDRODIURIL) 25 MG tablet Take 1 tablet (25 mg total) by mouth daily. 03/12/15   Ozella Rocks, MD  Menthol, Topical Analgesic, (BENGAY EX) Apply topically.    [provider]    Family History Family History  Problem Relation Age of Onset  . Cancer Mother   . Heart failure Father   . Diabetes Brother     Social History Social History  Substance Use Topics  . Smoking status: Current Every Day Smoker    Packs/day: 1.00  . Smokeless tobacco: Not on file  . Alcohol use Yes     Allergies   Patient has no known allergies.   Review of Systems Review of Systems  Constitutional: Negative for fever.  HENT: Negative for sore throat.   Eyes: Negative for visual disturbance.  Respiratory: Negative for shortness of breath.   Cardiovascular: Negative for chest pain.  Gastrointestinal: Positive for nausea and vomiting. Negative for abdominal pain, constipation and diarrhea.  Genitourinary: Negative for difficulty urinating and dysuria.  Musculoskeletal: Negative for back pain and neck stiffness.  Skin: Negative for rash.  Neurological: Positive for dizziness and light-headedness. Negative for syncope, facial asymmetry, speech difficulty, weakness, numbness and headaches.     Physical Exam Updated Vital Signs BP (!) 148/74   Pulse 78   Temp (!) 95.9 F (35.5 C) (Axillary)   Resp (!) 22   SpO2  90%   Physical Exam  Constitutional: He is oriented to person, place, and time. He appears well-developed and well-nourished. No distress.  HENT:  Head: Normocephalic and atraumatic.  Eyes: Conjunctivae and EOM are normal.  Neck: Normal range of motion.  Cardiovascular: Normal rate, regular rhythm, normal heart sounds and intact distal pulses.  Exam reveals no gallop and no friction rub.   No murmur heard. Pulmonary/Chest: Effort normal and breath sounds normal. No respiratory distress. He has no wheezes. He has no rales.    Abdominal: Soft. He exhibits no distension. There is no tenderness. There is no guarding.  Musculoskeletal: He exhibits no edema.  Neurological: He is alert and oriented to person, place, and time. He has normal strength. No cranial nerve deficit or sensory deficit. Coordination (normal finger to nose and heel to shin) normal. Abnormal gait: deferred due to fall risk. GCS eye subscore is 4. GCS verbal subscore is 5. GCS motor subscore is 6.  Skin: Skin is warm and dry. He is not diaphoretic.  Nursing note and vitals reviewed.    ED Treatments / Results  Labs (all labs ordered are listed, but only abnormal results are displayed) Labs Reviewed  BASIC METABOLIC PANEL - Abnormal; Notable for the following:       Result Value   Sodium 127 (*)    Chloride 97 (*)    CO2 16 (*)    Glucose, Bld 136 (*)    Creatinine, Ser 2.41 (*)    Calcium 8.1 (*)    GFR calc non Af Amer 27 (*)    GFR calc Af Amer 31 (*)    All other components within normal limits  CBC - Abnormal; Notable for the following:    RBC 3.27 (*)    Hemoglobin 9.3 (*)    HCT 28.7 (*)    RDW 16.6 (*)    All other components within normal limits  HEPATIC FUNCTION PANEL - Abnormal; Notable for the following:    Albumin 2.5 (*)    AST 195 (*)    All other components within normal limits  I-STAT TROPOININ, ED - Abnormal; Notable for the following:    Troponin i, poc 0.09 (*)    All other components within normal limits  LIPASE, BLOOD  URINALYSIS, ROUTINE W REFLEX MICROSCOPIC  OSMOLALITY  TROPONIN I  TROPONIN I  TROPONIN I  PATHOLOGIST SMEAR REVIEW  CK  OSMOLALITY, URINE  RAPID URINE DRUG SCREEN, HOSP PERFORMED  VITAMIN B12  FOLATE  FERRITIN  RETICULOCYTES  PROTIME-INR  HIV ANTIBODY (ROUTINE TESTING)  HEPATITIS C ANTIBODY  CBG MONITORING, ED  I-STAT TROPOININ, ED    EKG  EKG Interpretation  Date/Time:  Thursday May 24 2017 03:58:38 EDT Ventricular Rate:  76 PR Interval:    QRS Duration: 146 QT  Interval:  471 QTC Calculation: 530 R Axis:   86 Text Interpretation:  Artifact, suspect sinus rhythm Anterior infarct, old Prolonged QT interval Artifact in lead(s) I II III aVR aVL aVF V1 V2 V6 No previous ECGs available Confirmed by Alvira MondaySchlossman, Salley Boxley ([id number]) on 05/24/2017 6:50:12 AM       Radiology Dg Chest 2 View  Result Date: 05/24/2017 CLINICAL DATA:  Low back pain and weakness this morning, chest pressure for week. History of hypertension and atrial fibrillation. EXAM: CHEST  2 VIEW COMPARISON:  None. FINDINGS: Cardiac silhouette is mildly enlarged, mediastinal silhouette is nonsuspicious. Diffuse interstitial prominence with patchy RIGHT lower lobe airspace opacity. No pleural effusion. No pneumothorax. Apical pleural thickening.  Soft tissue planes and included osseous structures are nonsuspicious. IMPRESSION: Interstitial prominence and RIGHT lower lobe consolidation concerning for bronchopneumonia. Followup PA and lateral chest X-ray is recommended in 3-4 weeks following trial of antibiotic therapy to ensure resolution and exclude underlying malignancy. Mild cardiomegaly. Electronically Signed   By: Awilda Metro M.D.   On: 05/24/2017 06:58   Ct Head Wo Contrast  Result Date: 05/24/2017 CLINICAL DATA:  Headache and nausea. Fainting like episode with posterior head injury. Initial encounter. EXAM: CT HEAD WITHOUT CONTRAST TECHNIQUE: Contiguous axial images were obtained from the base of the skull through the vertex without intravenous contrast. COMPARISON:  None. FINDINGS: Brain: There is a subcortical anterior left frontal hematoma measuring 19 x 12 x 18 mm (2 cc volume) with a rim of edema. The overlying cortex is visible. No intracranial hemorrhage seen elsewhere. This is an atypical pattern for isolated traumatic hemorrhage in this patient with reported low velocity injury. No infarct changes, hydrocephalus, or shift. There is minimal local mass effect at the hematoma. Vascular:  Atherosclerotic calcifications.  No hyperdense vessel. Skull: No fracture or visible scalp swelling. Sinuses/Orbits: No evidence of injury. Critical Value/emergent results were called by telephone at the time of interpretation on 05/24/2017 at 7:15 am to Dr. Alvira Monday , who verbally acknowledged these results. IMPRESSION: Subcortical anterior left frontal hematoma measuring 19 x 18 x 12 mm (2 cc volume). Location is not typical for a low velocity head trauma and primary lobar hemorrhage or mass should be considered. Electronically Signed   By: Marnee Spring M.D.   On: 05/24/2017 07:14    Procedures Procedures (including critical care time)  Medications Ordered in ED Medications  ondansetron (ZOFRAN) injection 4 mg (4 mg Intravenous Refused 05/24/17 0628)  sodium chloride 0.9 % bolus 500 mL (not administered)  sodium chloride 0.9 % bolus 500 mL (500 mLs Intravenous New Bag/Given 05/24/17 8119)   CRITICAL CARE:Intracranial bleed Performed by: Rhae Lerner   Total critical care time: 30 minutes  Critical care time was exclusive of separately billable procedures and treating other patients.  Critical care was necessary to treat or prevent imminent or life-threatening deterioration.  Critical care was time spent personally by me on the following activities: development of treatment plan with patient and/or surrogate as well as nursing, discussions with consultants, evaluation of patient's response to treatment, examination of patient, obtaining history from patient or surrogate, ordering and performing treatments and interventions, ordering and review of laboratory studies, ordering and review of radiographic studies, pulse oximetry and re-evaluation of patient's condition.   Initial Impression / Assessment and Plan / ED Course  I have reviewed the triage vital signs and the nursing notes.  Pertinent labs & imaging results that were available during my care of the patient  were reviewed by me and considered in my medical decision making (see chart for details).     61 year old male with a history of hypertension and hyperlipidemia presents with concern for nausea, vomiting, dizziness and fall.  EMS initially found patient's heart rate to be in the 40s, with blood pressure in the 80s, and initiated an epinephrine drip. On arrival to the emergency department, the epinephrine drip was discontinued, and patient maintained heart rate and blood pressures which were within normal limits. Unclear if this episode witnessed with EMS record represented vasovagal effect or other.   Patient denies chest pain or shortness of breath, doubt ACS/PE.  ECG with artifact, no significant findings. Initial troponin negative, second troponin likely elevated  in setting of stress.  Chest XR with concern for RLL bronchopneumonia with recommendation to repeat XR in 3-4 weeks. Discussed with IM regarding IV abx in ED or not, however we do not feel he is septic, vital signs normal in ED (axillary temp likely inaccurate), cough began today, no WBC. Internal medicine agrees to continue to observe pt closely clinically prior to initiation of sepsis protocol/abx. Urinalysis ordered and pending.  CT head shows left frontal hematoma, less likely secondary to trauma.  Labs are also significant for hyponatremia with sodium of 127, and nondistended anion gap metabolic acidosis with a bicarbonate of 16, creatinine of 2.41 from most recent of 1.1. Hemoglobin 9.3 from 14.3. Patient denies any GI bleeding symptoms. Unclear acuity of these findings with last labwork in our system from 2016, however pt reports he has not been told of kidney disease or other abnormalities.   Spoke with [facility] regarding frontal hematoma.  Internal medicine consulted for admission.  Patient updated regarding plan of care.   Final Clinical Impressions(s) / ED Diagnoses   Final diagnoses:  Fall, initial encounter  Intracranial bleed (HCC)   Elevated serum creatinine  Hematoma  Hyponatremia    New Prescriptions New Prescriptions   No medications on file         Alvira Monday, MD 05/24/17 [phone]

## 2017-05-24 NOTE — Consult Note (Signed)
CC:  Chief Complaint  Patient presents with  . Fall  . Bradycardia    HPI: Joseph Nash is a 61 y.o. male who presented to ER via EMS after falling at home. Yesterday evening, started to feel like his "BP was low" - having nausea with vomiting and lightheadedness. Went to bed thinking it would resolve with sleep but woke up to vomit and fell twice, once hitting occiput. Difficulties getting back up so called EMS.  No LOC. No amnesia. No urinary/fecal incontinence. Upon arrival, EMS reports bradycardia and hypotension. Currently normalized. Feels well currently. Denies any neuro symptoms including headaches. Not on blood thinners. Negative personal history of cancer.  PMH: Past Medical History:  Diagnosis Date  . Atrial fibrillation (HCC)   . Hypertension     PSH: History reviewed. No pertinent surgical history.  SH: Social History  Substance Use Topics  . Smoking status: Current Every Day Smoker    Packs/day: 1.00  . Smokeless tobacco: Not on file  . Alcohol use Yes    MEDS: Prior to Admission medications   Medication Sig Start Date End Date Taking? Authorizing Provider  acetaminophen (TYLENOL) 325 MG tablet Take 650 mg by mouth every 6 (six) hours as needed.    [provider]  diclofenac (VOLTAREN) 75 MG EC tablet Take 1 tablet (75 mg total) by mouth 2 (two) times daily. 03/12/15   Ozella RocksMerrell, David J, MD  hydrochlorothiazide (HYDRODIURIL) 25 MG tablet Take 1 tablet (25 mg total) by mouth daily. 03/12/15   Ozella RocksMerrell, David J, MD  Menthol, Topical Analgesic, (BENGAY EX) Apply topically.    [provider]    ALLERGY: No Known Allergies  ROS: Review of Systems  Constitutional: Negative.   HENT: Negative.   Respiratory: Positive for cough.   Gastrointestinal: Positive for nausea and vomiting.  Genitourinary: Negative.   Skin: Negative.   Neurological: Negative for dizziness, tingling, tremors, sensory change, speech change, focal weakness, seizures, loss  of consciousness and headaches.    Vitals:   05/24/17 0730 05/24/17 0745  BP:  (!) 148/74  Pulse: 79 78  Resp: (!) 27 (!) 22  Temp:     General appearance: Malnourished, NAD Eyes: PERRL, Fundoscopic: normal Cardiovascular: Regular rate and rhythm without murmurs, rubs, gallops. No edema or variciosities. Distal pulses normal. Pulmonary: Clear to auscultation Musculoskeletal:     Muscle tone upper extremities: Normal    Muscle tone lower extremities: Normal    Motor exam: Upper Extremities Deltoid Bicep Tricep Grip  Right 5/5 5/5 5/5 5/5  Left 5/5 5/5 5/5 5/5   Lower Extremity IP Quad PF DF EHL  Right 5/5 5/5 5/5 5/5 5/5  Left 5/5 5/5 5/5 5/5 5/5   Neurological Awake, alert, oriented Memory and concentration grossly intact Speech fluent, appropriate CNII: Visual fields normal CNIII/IV/VI: EOMI CNV: Facial sensation normal CNVII: Symmetric, normal strength CNVIII: Grossly normal CNIX: Normal palate movement CNXI: Trap and SCM strength normal CN XII: Tongue protrusion normal Sensation grossly intact to LT DTR: Normal Coordination (finger/nose & heel/shin): Normal  IMAGING: CT HEAD IMPRESSION: Subcortical anterior left frontal hematoma measuring 19 x 18 x 12 mm (2 cc volume). Location is not typical for a low velocity head trauma and primary lobar hemorrhage or mass should be considered.  IMPRESSION/PLAN 61 y.o. male with subcortical anterior left frontal ?hematoma. Area does not look like a typical hematoma and does not necessarily correlate with occipital head trauma. He is neurologically intact and without deficits. He is not on  anti-coagulation. No acute neurosurgical intervention is indicated. I have been advised that he will be admitted under hospitalist service due to other medical issues. - Keppra 500mg  BID x7days for seizure prophylaxis - MRI w/ wo to further characterize area, concern for possible malignancy (pending) - Neuro exam q 1 hour - Repeat head CT  in 12 hours pending MRI, sooner as indicated

## 2017-05-25 ENCOUNTER — Inpatient Hospital Stay (HOSPITAL_COMMUNITY): Payer: Self-pay

## 2017-05-25 DIAGNOSIS — S06360A Traumatic hemorrhage of cerebrum, unspecified, without loss of consciousness, initial encounter: Secondary | ICD-10-CM

## 2017-05-25 DIAGNOSIS — B182 Chronic viral hepatitis C: Secondary | ICD-10-CM

## 2017-05-25 DIAGNOSIS — Z9181 History of falling: Secondary | ICD-10-CM

## 2017-05-25 DIAGNOSIS — E871 Hypo-osmolality and hyponatremia: Secondary | ICD-10-CM

## 2017-05-25 DIAGNOSIS — N179 Acute kidney failure, unspecified: Secondary | ICD-10-CM

## 2017-05-25 DIAGNOSIS — Z7982 Long term (current) use of aspirin: Secondary | ICD-10-CM

## 2017-05-25 DIAGNOSIS — Z79899 Other long term (current) drug therapy: Secondary | ICD-10-CM

## 2017-05-25 DIAGNOSIS — Y92009 Unspecified place in unspecified non-institutional (private) residence as the place of occurrence of the external cause: Secondary | ICD-10-CM

## 2017-05-25 DIAGNOSIS — I10 Essential (primary) hypertension: Secondary | ICD-10-CM

## 2017-05-25 DIAGNOSIS — I480 Paroxysmal atrial fibrillation: Secondary | ICD-10-CM

## 2017-05-25 DIAGNOSIS — W1839XA Other fall on same level, initial encounter: Secondary | ICD-10-CM

## 2017-05-25 LAB — CBC
HEMATOCRIT: 32 % — AB (ref 39.0–52.0)
HEMOGLOBIN: 10.6 g/dL — AB (ref 13.0–17.0)
MCH: 29.3 pg (ref 26.0–34.0)
MCHC: 33.1 g/dL (ref 30.0–36.0)
MCV: 88.4 fL (ref 78.0–100.0)
Platelets: 387 10*3/uL (ref 150–400)
RBC: 3.62 MIL/uL — AB (ref 4.22–5.81)
RDW: 16.8 % — AB (ref 11.5–15.5)
WBC: 9.2 10*3/uL (ref 4.0–10.5)

## 2017-05-25 LAB — COMPREHENSIVE METABOLIC PANEL
ALK PHOS: 117 U/L (ref 38–126)
ALT: 171 U/L — AB (ref 17–63)
ANION GAP: 8 (ref 5–15)
AST: 649 U/L — ABNORMAL HIGH (ref 15–41)
Albumin: 2.3 g/dL — ABNORMAL LOW (ref 3.5–5.0)
BILIRUBIN TOTAL: 0.6 mg/dL (ref 0.3–1.2)
BUN: 14 mg/dL (ref 6–20)
CALCIUM: 8.4 mg/dL — AB (ref 8.9–10.3)
CO2: 22 mmol/L (ref 22–32)
CREATININE: 1.48 mg/dL — AB (ref 0.61–1.24)
Chloride: 107 mmol/L (ref 101–111)
GFR calc non Af Amer: 49 mL/min — ABNORMAL LOW (ref >60)
GFR, EST AFRICAN AMERICAN: 57 mL/min — AB (ref >60)
Glucose, Bld: 92 mg/dL (ref 65–99)
Potassium: 3.2 mmol/L — ABNORMAL LOW (ref 3.5–5.1)
Sodium: 137 mmol/L (ref 135–145)
TOTAL PROTEIN: 7.8 g/dL (ref 6.5–8.1)

## 2017-05-25 LAB — SODIUM, URINE, RANDOM: Sodium, Ur: 42 mmol/L

## 2017-05-25 LAB — CREATININE, URINE, RANDOM: CREATININE, URINE: 41.58 mg/dL

## 2017-05-25 LAB — HEPATITIS C ANTIBODY: HCV Ab: >11 s/co ratio — ABNORMAL HIGH (ref 0.0–0.9)

## 2017-05-25 MED ORDER — POTASSIUM CHLORIDE CRYS ER 20 MEQ PO TBCR
40.0000 meq | EXTENDED_RELEASE_TABLET | Freq: Two times a day (BID) | ORAL | Status: DC
  Administered 2017-05-25: 40 meq via ORAL
  Filled 2017-05-25: qty 2

## 2017-05-25 MED ORDER — HYDRALAZINE HCL 20 MG/ML IJ SOLN
5.0000 mg | INTRAMUSCULAR | Status: DC | PRN
  Administered 2017-05-25 (×2): 5 mg via INTRAVENOUS
  Filled 2017-05-25 (×2): qty 1

## 2017-05-25 MED ORDER — LEVETIRACETAM 500 MG PO TABS
500.0000 mg | ORAL_TABLET | Freq: Two times a day (BID) | ORAL | 0 refills | Status: DC
Start: 2017-05-25 — End: 2018-06-10

## 2017-05-25 NOTE — Progress Notes (Addendum)
Pt seen and examined.  No NS issues overnight. Feels well this morning and is without concerns Denies neuro symptoms  EXAM: Temp:  [97.2 F (36.2 C)-99.4 F (37.4 C)] 98.9 F (37.2 C) (06/15 0451) Pulse Rate:  [78-124] 124 (06/15 0451) Resp:  [18-28] 18 (06/15 0451) BP: (118-207)/(74-101) 153/90 (06/15 0451) SpO2:  [89 %-100 %] 99 % (06/15 0451) Weight:  [59.9 kg (132 lb)] 59.9 kg (132 lb) (06/15 0100) Intake/Output      06/14 0701 - 06/15 0700 06/15 0701 - 06/16 0700   P.O. 120    Total Intake(mL/kg) 120 (2)    Urine (mL/kg/hr) 1000 (0.7)    Total Output 1000     Net -880          Urine Occurrence 1 x     Awake and alert Follows commands throughout Full strength CN grossly intact  Plan Appears to be doing well Still pending repeat head CT If stable, cleared from NS standpoint Complete Keppra 7 day course F/U outpt in 8-12 weeks for repeat MRI brain  Addendum CT scan is stable. Cleared from NS standpoint.  Recs as above

## 2017-05-25 NOTE — Evaluation (Signed)
Physical Therapy Evaluation Patient Details Name: Joseph Nash MRN: [id number] DOB: Dec 21, 1955 Today's Date: 05/25/2017   History of Present Illness  pt is a 61 y/o male with pmh significant for PAF, HTN, who presented to the ED after acute onset of dizziness, vomiting ultimately resulting in a fall.  MRI showing a 3 cm lesion in the medial left superior frontal gyrus is  Clinical Impression  Pt is at or close to baseline functioning and should be safe at home with available family checking on him. There are no further acute PT needs.  Will sign off at this time.     Follow Up Recommendations No PT follow up    Equipment Recommendations  None recommended by PT    Recommendations for Other Services       Precautions / Restrictions Precautions Precautions:  (mild fall risk)      Mobility  Bed Mobility Overal bed mobility: Independent                Transfers Overall transfer level: Independent                  Ambulation/Gait Ambulation/Gait assistance: Supervision Ambulation Distance (Feet): 400 Feet Assistive device: None Gait Pattern/deviations: Step-through pattern   Gait velocity interpretation: at or above normal speed for age/gender General Gait Details: generally steady with challenges producing no deviation or overt LOB.  Stairs Stairs: Yes Stairs assistance: Independent Stair Management: No rails;Alternating pattern;Forwards Number of Stairs: 5 General stair comments: steady overall  Wheelchair Mobility    Modified Rankin (Stroke Patients Only)       Balance Overall balance assessment: No apparent balance deficits (not formally assessed)                               Standardized Balance Assessment Standardized Balance Assessment : Dynamic Gait Index   Dynamic Gait Index Level Surface: Normal Change in Gait Speed: Normal Gait with Horizontal Head Turns: Normal Gait with Vertical Head Turns: Normal Gait and  Pivot Turn: Normal Step Over Obstacle: Normal Step Around Obstacles: Normal Steps: Normal Total Score: 24       Pertinent Vitals/Pain Pain Assessment: No/denies pain    Home Living Family/patient expects to be discharged to:: Private residence Living Arrangements: Alone Available Help at Discharge: Family;Available PRN/intermittently Type of Home: Apartment Home Access: Elevator     Home Layout: One level Home Equipment: Grab bars - toilet;Grab bars - tub/shower      Prior Function Level of Independence: Independent         Comments: pays a friend to take him grocery shopping.     Hand Dominance        Extremity/Trunk Assessment   Upper Extremity Assessment Upper Extremity Assessment: Overall WFL for tasks assessed    Lower Extremity Assessment Lower Extremity Assessment: Overall WFL for tasks assessed    Cervical / Trunk Assessment Cervical / Trunk Assessment: Normal  Communication   Communication: No difficulties  Cognition Arousal/Alertness: Awake/alert Behavior During Therapy: WFL for tasks assessed/performed Overall Cognitive Status: Within Functional Limits for tasks assessed                                        General Comments General comments (skin integrity, edema, etc.): Pt report very minor dizziness on rolling and up to EOB and turning head out  walking halls.  Check for posterior canal BPPV while at EOB with modified Hallpike-Dix.  Pt got mildly dizzy to the left, but not dizziness to the R.  Still though, this was not significant to worry about at this time.    Exercises     Assessment/Plan    PT Assessment Patent does not need any further PT services  PT Problem List         PT Treatment Interventions      PT Goals (Current goals can be found in the Care Plan section)  Acute Rehab PT Goals PT Goal Formulation: All assessment and education complete, DC therapy    Frequency     Barriers to discharge         Co-evaluation               AM-PAC PT "6 Clicks" Daily Activity  Outcome Measure Difficulty turning over in bed (including adjusting bedclothes, sheets and blankets)?: None Difficulty moving from lying on back to sitting on the side of the bed? : None Difficulty sitting down on and standing up from a chair with arms (e.g., wheelchair, bedside commode, etc,.)?: None Help needed moving to and from a bed to chair (including a wheelchair)?: None Help needed walking in hospital room?: None Help needed climbing 3-5 steps with a railing? : None 6 Click Score: 24    End of Session   Activity Tolerance: Patient tolerated treatment well Patient left: in bed;with call bell/phone within reach Nurse Communication: Mobility status PT Visit Diagnosis: Unsteadiness on feet (R26.81);Other abnormalities of gait and mobility (R26.89)    Time: [id number] PT Time Calculation (min) (ACUTE ONLY): 26 min   Charges:   PT Evaluation $PT Eval Low Complexity: 1 Procedure PT Treatments $Gait Training: 8-22 mins   PT G Codes:        05/25/2017  [place] BingKen Jerid Catherman, PT [phone]  (pager)  Joseph Nash 05/25/2017, 2:21 PM

## 2017-05-25 NOTE — Care Management Note (Addendum)
Case Management Note  Patient Details  Name: Fuller MandrilRicardo F Heindl MRN: [id number] Date of Birth: 1956/11/27  Subjective/Objective:  Pt in with intracranial hematoma. He is from home alone. Pt states he has family that can assist at times. Pt sees Dr Randa EvensEdwards and is happy with her care. He uses [facility] for his medication because they deliver. He is not interested in changing pharmacies.                 Action/Plan: No f/u per PT. Plan is for patient to return home. CM will follow for d/c medications to see if he will need assistance. Pt states he has transportation home.  Expected Discharge Date:                  Expected Discharge Plan:  Home/Self Care  In-House Referral:     Discharge planning Services  CM Consult, Medication Assistance  Post Acute Care Choice:    Choice offered to:     DME Arranged:    DME Agency:     HH Arranged:    HH Agency:     Status of Service:  In process, will continue to follow  If discussed at Long Length of Stay Meetings, dates discussed:    Additional Comments:  Kermit BaloKelli F Isyss Espinal, RN 05/25/2017, 4:09 PM

## 2017-05-25 NOTE — Progress Notes (Signed)
   Subjective: [name] is feeling fine today, he has not been out of his bed to walk around today but denies dizziness while lying in his bed. He denies headache, chest pain, or difficulty breathing. He was not aware of his HCV status.   Objective:  Vital signs in last 24 hours: Vitals:   05/25/17 0242 05/25/17 0400 05/25/17 0451 05/25/17 0900  BP: (!) 200/90 (!) 202/87 (!) 153/90 (!) 142/88  Pulse:  (!) 116 (!) 124 (!) 102  Resp: 18 18 18    Temp:   98.9 F (37.2 C) 98.6 F (37 C)  TempSrc:   Oral Oral  SpO2:   99% 99%  Weight:      Height:        Assessment/Plan:    Intracranial hematoma (HCC) Etiology remains unclear, patient denies any recent head trauma and the hematoma remained stable in size on follow up CT this morning. - checking orthostatic vitals today  - PT eval and treatment today > they found that he was close to baseline function and is safe for discharge to home  - prophylactic keppra for 7 days  - neuro follow up in 8 weeks     Paroxysmal atrial fibrillation (HCC) NSR on exam today  - continue home med metoprolol at discharge    Acute kidney injury (HCC) Creatinine improving after IV fluid repletion in the ED.  - Follow up BMP at hospital follow up    Hyponatremia Resolved     Hypertension He came in hypotensive but became hypertensive with SBP 200s overnight, this responded well to IV hydralizine.  -restart home med metoprolol  - hold home med diltiazem    HCV antibody positive   - follow up HCV RNA quantitative with genotype  -HIV negative  Dispo: Anticipated discharge today  Eulah PontBlum, Christifer Chapdelaine, MD 05/25/2017, 3:53 PM Pager: [phone]

## 2017-05-25 NOTE — Progress Notes (Signed)
Pt discharged home. Discharge instructions were reviewed with the patient. PT verbalized understanding.  

## 2017-05-25 NOTE — Discharge Instructions (Signed)
[name]   For your dizziness,  STOP taking Diltiazem   For the bleeding in your head  START taking keppra twice daily for the next week  Follow up with neurology in 8 weeks   Call the internal medicine center if you have any questions about your hospitalization [phone]

## 2017-05-25 NOTE — Care Management Note (Signed)
Case Management Note  Patient Details  Name: Joseph Nash MRN: [id number] Date of Birth: 03-18-1956  Subjective/Objective:                    Action/Plan: Patient discharging home with self care. No f/u per PT. Pt starting on Keppra. CM called pts pharmacy and his cost will be $4.97/ month. Patient states he can afford this. Patients niece to provide transportation home.  Expected Discharge Date:  05/25/17               Expected Discharge Plan:  Home/Self Care  In-House Referral:     Discharge planning Services  CM Consult, Medication Assistance  Post Acute Care Choice:    Choice offered to:     DME Arranged:    DME Agency:     HH Arranged:    HH Agency:     Status of Service:  Completed, signed off  If discussed at [employer] Length of Stay Meetings, dates discussed:    Additional Comments:  Kermit BaloKelli F Siyah Mault, RN 05/25/2017, 5:19 PM

## 2017-05-28 LAB — HCV RNA QUANT RFLX ULTRA OR GENOTYP
HCV RNA QNT(LOG COPY/ML): 6.083 {Log_IU}/mL
HepC Qn: 1210000 IU/mL

## 2017-05-28 LAB — HEPATITIS C GENOTYPE

## 2017-06-02 NOTE — Discharge Summary (Signed)
Name: Joseph Nash MRN: [id number] DOB: 1956-11-30 61 y.o. PCP: Grayce Sessions, NP  Date of Admission: 05/24/2017  3:48 AM Date of Discharge: 05/25/2017 Attending Physician: Carlynn Purl, DO   Discharge Diagnosis: 1. Intracranial hematoma (HCC)  Discharge Medications: Allergies as of 05/25/2017   No Known Allergies     Medication List    STOP taking these medications   DILT-XR 240 MG 24 hr capsule Generic drug:  diltiazem   hydrochlorothiazide 25 MG tablet Commonly known as:  HYDRODIURIL     TAKE these medications   aspirin EC 81 MG tablet Take 81 mg by mouth daily.   diclofenac 75 MG EC tablet Commonly known as:  VOLTAREN Take 1 tablet (75 mg total) by mouth 2 (two) times daily.   ibuprofen 600 MG tablet Commonly known as:  ADVIL,MOTRIN Take 600 mg by mouth every 6 (six) hours as needed for headache or moderate pain.   levETIRAcetam 500 MG tablet Commonly known as:  KEPPRA Take 1 tablet (500 mg total) by mouth 2 (two) times daily.   Melatonin 5 MG Chew Chew 5 mg by mouth daily as needed (for sleep).   metoprolol succinate 100 MG 24 hr tablet Commonly known as:  TOPROL-XL Take 100 mg by mouth daily. Take with or immediately following a meal.   triamterene-hydrochlorothiazide 75-50 MG tablet Commonly known as:  MAXZIDE Take 0.5 tablets by mouth daily.       Disposition and follow-up:   Joseph Nash was discharged from [facility] in Stable condition.  At the hospital follow up visit please address:  1.  Intracranial hematoma- confirm neurosurgery follow up 8 weeks after hospitalization for repeat MRI brain  and that he completed 1 week of Keppra twice daily.  HCV antibody positive- Found to have positive HCV antibody, with 1,200,000 copies and genotype 1b. He was not aware of this diagnosis and had not received treatment for this in the past but this will need to be addressed at hospital follow up.   2.  Labs / imaging  needed at time of follow-up: MRI brain 8-12 weeks after hospitalization, BMP (check for resolution of AKI)   3.  Pending labs/ test needing follow-up: HCV RNA quantitative with genotype   Follow-up Appointments: Follow-up Information    Costella, Darci Current, PA-C. Schedule an appointment as soon as possible for a visit in 8 week(s).   Specialty:  Physician Assistant Contact information: [address] [place] [address] [phone]           Hospital Course by problem list:    Intracranial hematoma [facility]) Joseph Nash now is a 61 year old man with a history of paroxysmal A. fib, hypertension who presented to the ED after a fall at home. Prior to the fall he had felt dizzy, when he had up to go to the restroom he lost his balance and fell backward in his head. He was on the floor for about 30 minutes and was able to get to the phone and call for help. He did report that he drinks about 6 beers per day but that he had not been drinking heavily in the day leading up to the fall. In the ED CT of the head noted a small frontal hematoma. Neurosurgery was consulted and recommended admission for observation. Overnight he remained asymptomatic. MRI revealed the same hematoma but noted that it seemed to be 24-9 days old. CT head on the following day showed stability of the size  of the hematoma. Neurosurgery consultation and started him on prophylactic Keppra for 7 days and recommended outpatient follow-up 8 weeks after admission for repeat MRI brain. PT evaluated him and found that he was close to baseline function was significant for him to discharge home. He is not able to recollect the fall or trauma 1 week prior to his presentation.    Paroxysmal atrial fibrillation (HCC) Chads score is 2 for hypertension and vascular disease. He remained in normal sinus rhythm for the duration of this admission.     Acute kidney injury (HCC) Initial creatinine was 2.4 up from baseline of 1.1, this responded to IV  fluid repletion and was thought to be related to dehydration.     Hyponatremia  Sodium was 127 at presentation with serum osmolality 281 and urine sodium 42, this responded well to IV fluid repletion and pointed towards hypovolemic hyponatremia.     Hypertension He was hypotensive with dizziness at presentation and became hypertensive with systolic blood pressures in the 200s overnight this responded well to IV hydralazine then his home medication metoprolol was restarted and this alone worked to control his blood pressure. On the day of discharge he had positive orthostatis so he was told to hold off on taking diltiazem until the time of follow up.      HCV antibody positive Found to have positive HCV antibody, with 1,200,000 copies and genotype 1b. He was not aware of this diagnosis and had not received treatment for this in the past but this will need to be addressed at hospital follow up.      Discharge Vitals:   BP (!) 142/88   Pulse (!) 110   Temp 99 F (37.2 C) (Oral)   Resp 20   Ht 5\' 11"  (1.803 m)   Wt 132 lb (59.9 kg)   SpO2 100%   BMI 18.41 kg/m   Pertinent Labs, Studies, and Procedures:  CT head 05/24/2017  Subcortical anterior left frontal hematoma measuring 19 x 18 x 12 mm (2 cc volume). Location is not typical for a low velocity head trauma and primary lobar hemorrhage or mass should be considered.  MRI brain 05/24/2017  1. Three cm lesion in the medial left superior frontal gyrus is consistent with intra-axial hemorrhage. Surrounding edema with mild regional mass effect. It is unclear whether this could be a hemorrhagic contusion related to the fall, as the blood products signal suggests the bleed might be up to a week old. No evidence of underlying vascular malformation or tumor, but a repeat Brain MRI without and with contrast once the hematoma has resolved would be more sensitive. 2. Brain parenchyma elsewhere appears normal for age.  CT head 05/25/2017  No  change since yesterday. Left frontal intraparenchymal hematoma, probably approximately 435 to 2310-day-old. No additional bleeding.  HCV RNA quantitative 1,210,000, Genotype 1b   Discharge Instructions: Discharge Instructions    Diet - low sodium heart healthy    Complete by:  As directed    Increase activity slowly    Complete by:  As directed       Signed: Eulah PontBlum, Loveta Dellis, MD 06/02/2017, 5:33 PM   Pager: [phone]

## 2017-11-07 ENCOUNTER — Encounter: Payer: Self-pay | Admitting: Internal Medicine

## 2018-06-09 ENCOUNTER — Encounter (HOSPITAL_COMMUNITY): Payer: Self-pay

## 2018-06-09 ENCOUNTER — Inpatient Hospital Stay (HOSPITAL_COMMUNITY): Payer: BLUE CROSS/BLUE SHIELD

## 2018-06-09 ENCOUNTER — Inpatient Hospital Stay (HOSPITAL_COMMUNITY)
Admission: EM | Admit: 2018-06-09 | Discharge: 2018-06-10 | DRG: 811 | Disposition: A | Discharge status: Home / Self Care | Payer: BLUE CROSS/BLUE SHIELD | Attending: Family Medicine | Admitting: Family Medicine

## 2018-06-09 DIAGNOSIS — Z7982 Long term (current) use of aspirin: Secondary | ICD-10-CM | POA: Diagnosis not present

## 2018-06-09 DIAGNOSIS — D649 Anemia, unspecified: Secondary | ICD-10-CM

## 2018-06-09 DIAGNOSIS — Z79899 Other long term (current) drug therapy: Secondary | ICD-10-CM

## 2018-06-09 DIAGNOSIS — Z8249 Family history of ischemic heart disease and other diseases of the circulatory system: Secondary | ICD-10-CM

## 2018-06-09 DIAGNOSIS — D509 Iron deficiency anemia, unspecified: Principal | ICD-10-CM | POA: Diagnosis present

## 2018-06-09 DIAGNOSIS — T502X5A Adverse effect of carbonic-anhydrase inhibitors, benzothiadiazides and other diuretics, initial encounter: Secondary | ICD-10-CM | POA: Diagnosis present

## 2018-06-09 DIAGNOSIS — I129 Hypertensive chronic kidney disease with stage 1 through stage 4 chronic kidney disease, or unspecified chronic kidney disease: Secondary | ICD-10-CM | POA: Diagnosis present

## 2018-06-09 DIAGNOSIS — N183 Chronic kidney disease, stage 3 (moderate): Secondary | ICD-10-CM | POA: Diagnosis present

## 2018-06-09 DIAGNOSIS — F17210 Nicotine dependence, cigarettes, uncomplicated: Secondary | ICD-10-CM | POA: Diagnosis present

## 2018-06-09 DIAGNOSIS — Z681 Body mass index (BMI) 19 or less, adult: Secondary | ICD-10-CM | POA: Diagnosis not present

## 2018-06-09 DIAGNOSIS — N189 Chronic kidney disease, unspecified: Secondary | ICD-10-CM

## 2018-06-09 DIAGNOSIS — Z79890 Hormone replacement therapy (postmenopausal): Secondary | ICD-10-CM | POA: Diagnosis not present

## 2018-06-09 DIAGNOSIS — F1490 Cocaine use, unspecified, uncomplicated: Secondary | ICD-10-CM | POA: Diagnosis present

## 2018-06-09 DIAGNOSIS — I4891 Unspecified atrial fibrillation: Secondary | ICD-10-CM

## 2018-06-09 DIAGNOSIS — E43 Unspecified severe protein-calorie malnutrition: Secondary | ICD-10-CM | POA: Diagnosis not present

## 2018-06-09 DIAGNOSIS — E871 Hypo-osmolality and hyponatremia: Secondary | ICD-10-CM | POA: Diagnosis not present

## 2018-06-09 DIAGNOSIS — B182 Chronic viral hepatitis C: Secondary | ICD-10-CM | POA: Diagnosis present

## 2018-06-09 DIAGNOSIS — Z7141 Alcohol abuse counseling and surveillance of alcoholic: Secondary | ICD-10-CM

## 2018-06-09 DIAGNOSIS — I480 Paroxysmal atrial fibrillation: Secondary | ICD-10-CM | POA: Diagnosis present

## 2018-06-09 DIAGNOSIS — F1010 Alcohol abuse, uncomplicated: Secondary | ICD-10-CM | POA: Diagnosis present

## 2018-06-09 DIAGNOSIS — Z23 Encounter for immunization: Secondary | ICD-10-CM

## 2018-06-09 LAB — ABO/RH: ABO/RH(D): O POS

## 2018-06-09 LAB — HEPATIC FUNCTION PANEL
ALBUMIN: 2.3 g/dL — AB (ref 3.5–5.0)
ALK PHOS: 75 U/L (ref 38–126)
ALT: 25 U/L (ref 0–44)
AST: 61 U/L — ABNORMAL HIGH (ref 15–41)
BILIRUBIN INDIRECT: 0.5 mg/dL (ref 0.3–0.9)
BILIRUBIN TOTAL: 0.6 mg/dL (ref 0.3–1.2)
Bilirubin, Direct: 0.1 mg/dL (ref 0.0–0.2)
Total Protein: 8.2 g/dL — ABNORMAL HIGH (ref 6.5–8.1)

## 2018-06-09 LAB — CBC
HEMATOCRIT: 15.6 % — AB (ref 39.0–52.0)
HEMATOCRIT: 15.5 % — AB (ref 39.0–52.0)
HEMOGLOBIN: 4.1 g/dL — AB (ref 13.0–17.0)
Hemoglobin: 4.2 g/dL — CL (ref 13.0–17.0)
MCH: 19.5 pg — ABNORMAL LOW (ref 26.0–34.0)
MCH: 19.8 pg — AB (ref 26.0–34.0)
MCHC: 26.3 g/dL — ABNORMAL LOW (ref 30.0–36.0)
MCHC: 27.1 g/dL — ABNORMAL LOW (ref 30.0–36.0)
MCV: 74.3 fL — AB (ref 78.0–100.0)
MCV: 73.1 fL — ABNORMAL LOW (ref 78.0–100.0)
Platelets: 306 10*3/uL (ref 150–400)
Platelets: 286 10*3/uL (ref 150–400)
RBC: 2.1 MIL/uL — ABNORMAL LOW (ref 4.22–5.81)
RBC: 2.12 MIL/uL — ABNORMAL LOW (ref 4.22–5.81)
RDW: 22.8 % — AB (ref 11.5–15.5)
RDW: 22.8 % — AB (ref 11.5–15.5)
WBC: 6.7 10*3/uL (ref 4.0–10.5)
WBC: 6.5 10*3/uL (ref 4.0–10.5)

## 2018-06-09 LAB — COMPREHENSIVE METABOLIC PANEL
ALBUMIN: 2.3 g/dL — AB (ref 3.5–5.0)
ALT: 27 U/L (ref 0–44)
ANION GAP: 9 (ref 5–15)
AST: 93 U/L — AB (ref 15–41)
Alkaline Phosphatase: 72 U/L (ref 38–126)
BUN: 19 mg/dL (ref 8–23)
CHLORIDE: 100 mmol/L (ref 98–111)
CO2: 22 mmol/L (ref 22–32)
Calcium: 8.3 mg/dL — ABNORMAL LOW (ref 8.9–10.3)
Creatinine, Ser: 2.09 mg/dL — ABNORMAL HIGH (ref 0.61–1.24)
GFR calc Af Amer: 37 mL/min — ABNORMAL LOW (ref >60)
GFR, EST NON AFRICAN AMERICAN: 32 mL/min — AB (ref >60)
Glucose, Bld: 95 mg/dL (ref 70–99)
POTASSIUM: 3.6 mmol/L (ref 3.5–5.1)
Sodium: 131 mmol/L — ABNORMAL LOW (ref 135–145)
Total Bilirubin: 0.5 mg/dL (ref 0.3–1.2)
Total Protein: 8.3 g/dL — ABNORMAL HIGH (ref 6.5–8.1)

## 2018-06-09 LAB — FOLATE: Folate: 12.4 ng/mL (ref >5.9)

## 2018-06-09 LAB — TSH: TSH: 2.494 u[IU]/mL (ref 0.350–4.500)

## 2018-06-09 LAB — IRON AND TIBC
IRON: 20 ug/dL — AB (ref 45–182)
SATURATION RATIOS: 4 % — AB (ref 17.9–39.5)
TIBC: 547 ug/dL — AB (ref 250–450)
UIBC: 527 ug/dL

## 2018-06-09 LAB — VITAMIN B12: Vitamin B-12: 347 pg/mL (ref 180–914)

## 2018-06-09 LAB — PREPARE RBC (CROSSMATCH)

## 2018-06-09 LAB — FERRITIN: FERRITIN: 15 ng/mL — AB (ref 24–336)

## 2018-06-09 LAB — APTT: aPTT: 33 seconds (ref 24–36)

## 2018-06-09 LAB — RETICULOCYTES
RBC.: 2.42 MIL/uL — ABNORMAL LOW (ref 4.22–5.81)
Retic Count, Absolute: 82.3 10*3/uL (ref 19.0–186.0)
Retic Ct Pct: 3.4 % — ABNORMAL HIGH (ref 0.4–3.1)

## 2018-06-09 LAB — PROTIME-INR
INR: 1.11
Prothrombin Time: 14.2 seconds (ref 11.4–15.2)

## 2018-06-09 LAB — POC OCCULT BLOOD, ED: Fecal Occult Bld: NEGATIVE

## 2018-06-09 LAB — SAVE SMEAR

## 2018-06-09 LAB — ETHANOL: Alcohol, Ethyl (B): <10 mg/dL (ref <10)

## 2018-06-09 MED ORDER — BISACODYL 10 MG RE SUPP: 10.0000 mg | Freq: Every day | RECTAL | Status: DC | PRN

## 2018-06-09 MED ORDER — MELATONIN 3 MG PO TABS
4.5000 mg | ORAL_TABLET | Freq: Every day | ORAL | Status: DC | PRN
  Filled 2018-06-09: qty 1.5

## 2018-06-09 MED ORDER — FOLIC ACID 1 MG PO TABS
1.0000 mg | ORAL_TABLET | Freq: Every day | ORAL | Status: DC
  Administered 2018-06-09 – 2018-06-10 (×2): 1 mg via ORAL
  Filled 2018-06-09 (×2): qty 1

## 2018-06-09 MED ORDER — ACETAMINOPHEN 325 MG PO TABS: 650.0000 mg | ORAL_TABLET | Freq: Four times a day (QID) | ORAL | Status: DC | PRN

## 2018-06-09 MED ORDER — FAMOTIDINE IN NACL 20-0.9 MG/50ML-% IV SOLN
20.0000 mg | Freq: Two times a day (BID) | INTRAVENOUS | Status: DC
  Administered 2018-06-09 – 2018-06-10 (×2): 20 mg via INTRAVENOUS
  Filled 2018-06-09 (×2): qty 50

## 2018-06-09 MED ORDER — THIAMINE HCL 100 MG/ML IJ SOLN: 100.0000 mg | Freq: Every day | INTRAMUSCULAR | Status: DC

## 2018-06-09 MED ORDER — ADULT MULTIVITAMIN W/MINERALS CH
1.0000 | ORAL_TABLET | Freq: Every day | ORAL | Status: DC
  Administered 2018-06-09 – 2018-06-10 (×2): 1 via ORAL
  Filled 2018-06-09 (×2): qty 1

## 2018-06-09 MED ORDER — PNEUMOCOCCAL VAC POLYVALENT 25 MCG/0.5ML IJ INJ
0.5000 mL | INJECTION | INTRAMUSCULAR | Status: AC
  Administered 2018-06-10: 0.5 mL via INTRAMUSCULAR
  Filled 2018-06-09: qty 0.5

## 2018-06-09 MED ORDER — VITAMIN B-1 100 MG PO TABS
100.0000 mg | ORAL_TABLET | Freq: Every day | ORAL | Status: DC
  Administered 2018-06-09 – 2018-06-10 (×2): 100 mg via ORAL
  Filled 2018-06-09 (×2): qty 1

## 2018-06-09 MED ORDER — METOPROLOL SUCCINATE ER 100 MG PO TB24
100.0000 mg | ORAL_TABLET | Freq: Every day | ORAL | Status: DC
  Administered 2018-06-09 – 2018-06-10 (×2): 100 mg via ORAL
  Filled 2018-06-09 (×2): qty 1

## 2018-06-09 MED ORDER — SENNOSIDES-DOCUSATE SODIUM 8.6-50 MG PO TABS
1.0000 | ORAL_TABLET | Freq: Every evening | ORAL | Status: DC | PRN
Start: 2018-06-09 — End: 2018-06-10

## 2018-06-09 MED ORDER — ONDANSETRON HCL 4 MG PO TABS: 4.0000 mg | ORAL_TABLET | Freq: Four times a day (QID) | ORAL | Status: DC | PRN

## 2018-06-09 MED ORDER — ACETAMINOPHEN 650 MG RE SUPP: 650.0000 mg | Freq: Four times a day (QID) | RECTAL | Status: DC | PRN

## 2018-06-09 MED ORDER — METOPROLOL SUCCINATE ER 100 MG PO TB24: 100.0000 mg | ORAL_TABLET | Freq: Every day | ORAL | Status: DC

## 2018-06-09 MED ORDER — SODIUM CHLORIDE 0.9% IV SOLUTION
Freq: Once | INTRAVENOUS | Status: AC
  Administered 2018-06-09: 15:00:00 via INTRAVENOUS

## 2018-06-09 MED ORDER — LORAZEPAM 0.5 MG PO TABS: 0.5000 mg | ORAL_TABLET | Freq: Three times a day (TID) | ORAL | Status: DC | PRN

## 2018-06-09 MED ORDER — HYDROCODONE-ACETAMINOPHEN 5-325 MG PO TABS: 1.0000 | ORAL_TABLET | ORAL | Status: DC | PRN

## 2018-06-09 MED ORDER — SODIUM CHLORIDE 0.9 % IV SOLN
INTRAVENOUS | Status: DC
  Administered 2018-06-10: 03:00:00 via INTRAVENOUS

## 2018-06-09 MED ORDER — LEVETIRACETAM 500 MG PO TABS
500.0000 mg | ORAL_TABLET | Freq: Two times a day (BID) | ORAL | Status: DC
Start: 2018-06-09 — End: 2018-06-10
  Administered 2018-06-09 – 2018-06-10 (×3): 500 mg via ORAL
  Filled 2018-06-09 (×3): qty 1

## 2018-06-09 MED ORDER — ONDANSETRON HCL 4 MG/2ML IJ SOLN: 4.0000 mg | Freq: Four times a day (QID) | INTRAMUSCULAR | Status: DC | PRN

## 2018-06-09 NOTE — Progress Notes (Signed)
Paged doctor regarding patient BP on admission and again post-transfusion (1st unit)/beginning of 2 unit.  Patient is not exhibiting any headaches/pain.  Doctor Purohit returned page.  Will continue to monitor for any S&S.  Per MD- did not need any further BP medication unless S&S occur.

## 2018-06-09 NOTE — ED Provider Notes (Addendum)
[facility]   CSN: [id number] Arrival date & time: 06/09/18  [id number]     History   Chief Complaint No chief complaint on file.   HPI Fuller MandrilRicardo F Nash is a 62 y.o. male.  HPI Patient presents to the emergency room for evaluation of symptomatic anemia.  Patient states he went to his primary care doctor's office for checkup.  He had been noticing increasing shortness of breath recently.  At rest he felt fine but with any exertion he was getting winded.  At his primary doctor's office, he had outpatient laboratory tests drawn.  Patient was called today and was told that his blood count was low and he needed to come to the hospital.  Patient denies any prior history of anemia.  He denies taking any blood thinning agents.  He denies any history of bloody stools or dark stools.  Patient denies taking any NSAIDs. Past Medical History:  Diagnosis Date  . Atrial fibrillation (HCC)   . Hypertension     Patient Active Problem List   Diagnosis Date Noted  . Chronic viral hepatitis C (HCC) 05/25/2017  . Paroxysmal atrial fibrillation (HCC) 05/24/2017  . Acute kidney injury (HCC) 05/24/2017  . Hyponatremia 05/24/2017  . Hypertension 05/24/2017  . Intracranial hematoma (HCC) 05/24/2017  . Hematoma 05/24/2017    Past Surgical History:  Procedure Laterality Date  . NO PAST SURGERIES          Home Medications    Prior to Admission medications   Medication Sig Start Date End Date Taking? Authorizing Provider  aspirin EC 81 MG tablet Take 81 mg by mouth daily.    [provider]  diclofenac (VOLTAREN) 75 MG EC tablet Take 1 tablet (75 mg total) by mouth 2 (two) times daily. Patient not taking: Reported on 05/24/2017 03/12/15   Ozella RocksMerrell, David J, MD  ibuprofen (ADVIL,MOTRIN) 600 MG tablet Take 600 mg by mouth every 6 (six) hours as needed for headache or moderate pain.    [provider]  levETIRAcetam (KEPPRA) 500 MG  tablet Take 1 tablet (500 mg total) by mouth 2 (two) times daily. 05/25/17   Eulah PontBlum, Nina, MD  Melatonin 5 MG CHEW Chew 5 mg by mouth daily as needed (for sleep).    [provider]  metoprolol succinate (TOPROL-XL) 100 MG 24 hr tablet Take 100 mg by mouth daily. Take with or immediately following a meal.    [provider]  triamterene-hydrochlorothiazide (MAXZIDE) 75-50 MG tablet Take 0.5 tablets by mouth daily. 04/30/17   [provider]    Family History Family History  Problem Relation Age of Onset  . Cancer Mother   . Heart failure Father   . Diabetes Brother     Social History Social History   Tobacco Use  . Smoking status: Current Every Day Smoker    Packs/day: 1.00  . Smokeless tobacco: Never Used  Substance Use Topics  . Alcohol use: Yes    Alcohol/week: 3.6 oz    Types: 6 Cans of beer per week    Comment: DAILY  . Drug use: Yes    Types: Cocaine     Allergies   Patient has no known allergies.   Review of Systems Review of Systems  All other systems reviewed and are negative.    Physical Exam Updated Vital Signs BP (!) 165/79 (BP Location: Right Arm)   Pulse (!) 114   Temp 97.9 F (36.6 C) (Oral)  Resp 17   Ht 1.753 m (5\' 9" )   SpO2 91%   BMI 19.49 kg/m   Physical Exam  Constitutional: He appears well-developed and well-nourished. No distress.  HENT:  Head: Normocephalic and atraumatic.  Right Ear: External ear normal.  Left Ear: External ear normal.  Eyes: Conjunctivae are normal. Right eye exhibits no discharge. Left eye exhibits no discharge. No scleral icterus.  Neck: Neck supple. No tracheal deviation present.  Cardiovascular: Regular rhythm and intact distal pulses. Tachycardia present.  Pulmonary/Chest: Effort normal and breath sounds normal. No stridor. No respiratory distress. He has no wheezes. He has no rales.  Abdominal: Soft. Bowel sounds are normal. He exhibits no distension. There is no tenderness. There  is no rebound and no guarding.  Musculoskeletal: He exhibits no edema or tenderness.  Neurological: He is alert. He has normal strength. No cranial nerve deficit (no facial droop, extraocular movements intact, no slurred speech) or sensory deficit. He exhibits normal muscle tone. He displays no seizure activity. Coordination normal.  Skin: Skin is warm and dry. No rash noted.  Psychiatric: He has a normal mood and affect.  Nursing note and vitals reviewed.    ED Treatments / Results  Labs (all labs ordered are listed, but only abnormal results are displayed) Labs Reviewed  COMPREHENSIVE METABOLIC PANEL - Abnormal; Notable for the following components:      Result Value   Sodium 131 (*)    Creatinine, Ser 2.09 (*)    Calcium 8.3 (*)    Total Protein 8.3 (*)    Albumin 2.3 (*)    AST 93 (*)    GFR calc non Af Amer 32 (*)    GFR calc Af Amer 37 (*)    All other components within normal limits  CBC - Abnormal; Notable for the following components:   RBC 2.10 (*)    Hemoglobin 4.1 (*)    HCT 15.6 (*)    MCV 74.3 (*)    MCH 19.5 (*)    MCHC 26.3 (*)    RDW 22.8 (*)    All other components within normal limits  PROTIME-INR  APTT  VITAMIN B12  FOLATE  IRON AND TIBC  FERRITIN  RETICULOCYTES  POC OCCULT BLOOD, ED  TYPE AND SCREEN  ABO/RH  PREPARE RBC (CROSSMATCH)    EKG Sinus tachycardia rate 114, possible left atrial enlargement, normal axis, normal intervals, no ST-T wave changes, no prior EKG for comparison  Radiology No results found.  Procedures .Critical Care Performed by: Linwood Dibbles, MD Authorized by: Linwood Dibbles, MD   Critical care provider statement:    Critical care time (minutes):  30   Critical care was time spent personally by me on the following activities:  Discussions with consultants, evaluation of patient's response to treatment, examination of patient, ordering and performing treatments and interventions, ordering and review of laboratory studies,  ordering and review of radiographic studies, pulse oximetry, re-evaluation of patient's condition, obtaining history from patient or surrogate and review of old charts   (including critical care time)  Medications Ordered in ED Medications  0.9 %  sodium chloride infusion (Manually program via Guardrails IV Fluids) (has no administration in time range)     Initial Impression / Assessment and Plan / ED Course  I have reviewed the triage vital signs and the nursing notes.  Pertinent labs & imaging results that were available during my care of the patient were reviewed by me and considered in my medical decision making (  see chart for details).    Patient presented to the emergency room for evaluation of anemia.  Patient's hemoglobin today confirms the outpatient laboratory test.  She is hemoglobin is 4.1.  Rest he appears no distress.  He is not having any difficulty breathing.  He is tachycardic.  This is most likely related to his atrial fibrillation and his anemia, we will add on an EKG. patient will need further work-up of his new onset anemia.  Symptoms but I will Hemoccult his stools.  I have ordered blood transfusions.  I will consult the medical service for admission.  Final Clinical Impressions(s) / ED Diagnoses   Final diagnoses:  Symptomatic anemia  Atrial fibrillation, unspecified type [facility])    ED Discharge Orders    None       Linwood Dibbles, MD 06/09/18 1241  Rectal exam performed when the patient was moved out of the hallway bed.  Brown stool, no gross blood, Hemoccult sent   Linwood Dibbles, MD 06/09/18 1252

## 2018-06-09 NOTE — ED Triage Notes (Signed)
[facility] sent from [facility] for further evaluation of low hemoglobin that was found during annual exam. Denies any illness, no dark stools, no hx of same

## 2018-06-09 NOTE — H&P (Addendum)
History and Physical    Joseph MandrilRicardo F Shepherd ION:[id number] DOB: 05/01/1956 DOA: 06/09/2018   PCP: Grayce SessionsEdwards, Michelle P, NP   Patient coming from:  Home    Chief Complaint: Severe anemia  HPI: Joseph Nash is a 62 y.o. male with a history of alcohol abuse, hypertension, chronic hepatitis, remote seizures,  CKD, brought to the emergency department, after his primary care physician called him with abnormal labs, for a hemoglobin of 4.1,The patient denies any recent illnesses, or dark stools or history of GI bleed.  He does carry a history of microcytic and iron deficiency anemia, but has not been on iron and still 2 days ago.  He does report increasing shortness of breath for the last few weeks, especially when walking 1 flight of stairs.  He denies any chest pain or palpitations.  He denies taking any blood thinners.  He does admit to taking significant amount of NSAIDs.  He denies any abdominal pain, nausea or vomiting.  He denies any hemoptysis, hematuria, hematemesis, or gum or nosebleed.  He denies having had a prior colonoscopy or endoscopy, or have been seen by GI or hematology.  He denies pica.  He denies any recent surgeries.  He never had a blood transfusion.  He denies any recent travel, or infections.  He drinks about 6 beers a day, smokes 1 pack of cigarettes in a week, denies any recreational drug use.   ED Course:  BP (!) 165/79 (BP Location: Right Arm)   Pulse (!) 114   Temp 97.9 F (36.6 C) (Oral)   Resp 17   Ht 5\' 9"  (1.753 m)   SpO2 91%   BMI 19.49 kg/m   Sodium 131, creatinine 2.09, Hemoglobin 4.1, with a hematocrit of 15.6, MCV 74.3. Iron studies pending PT 14.2, INR 1.11, PTT 33 Reticulocyte count normal at 82 Rectal exam per EDP was negative for gross blood, stool was brown Hemoccult is negative  CXR Sinus tachycardia. Possible Left atrial enlargement Borderline ECG   Review of Systems:  As per HPI otherwise all other systems reviewed and are negative  Past  Medical History:  Diagnosis Date  . Atrial fibrillation (HCC)   . Hypertension     Past Surgical History:  Procedure Laterality Date  . NO PAST SURGERIES      Social History Social History   Socioeconomic History  . Marital status: Single    Spouse name: Not on file  . Number of children: Not on file  . Years of education: Not on file  . Highest education level: Not on file  Occupational History  . Not on file  Social Needs  . Financial resource strain: Not on file  . Food insecurity:    Worry: Not on file    Inability: Not on file  . Transportation needs:    Medical: Not on file    Non-medical: Not on file  Tobacco Use  . Smoking status: Current Every Day Smoker    Packs/day: 1.00  . Smokeless tobacco: Never Used  Substance and Sexual Activity  . Alcohol use: Yes    Alcohol/week: 3.6 oz    Types: 6 Cans of beer per week    Comment: DAILY  . Drug use: Yes    Types: Cocaine  . Sexual activity: Not on file  Lifestyle  . Physical activity:    Days per week: Not on file    Minutes per session: Not on file  . Stress: Not on file  Relationships  .  Social connections:    Talks on phone: Not on file    Gets together: Not on file    Attends religious service: Not on file    Active member of club or organization: Not on file    Attends meetings of clubs or organizations: Not on file    Relationship status: Not on file  . Intimate partner violence:    Fear of current or ex partner: Not on file    Emotionally abused: Not on file    Physically abused: Not on file    Forced sexual activity: Not on file  Other Topics Concern  . Not on file  Social History Narrative  . Not on file     No Known Allergies  Family History  Problem Relation Age of Onset  . Cancer Mother   . Heart failure Father   . Diabetes Brother        Prior to Admission medications   Medication Sig Start Date End Date Taking? Authorizing Provider  aspirin EC 81 MG tablet Take 81 mg by  mouth daily.    [provider]  diclofenac (VOLTAREN) 75 MG EC tablet Take 1 tablet (75 mg total) by mouth 2 (two) times daily. Patient not taking: Reported on 05/24/2017 03/12/15   Ozella Rocks, MD  ibuprofen (ADVIL,MOTRIN) 600 MG tablet Take 600 mg by mouth every 6 (six) hours as needed for headache or moderate pain.    [provider]  levETIRAcetam (KEPPRA) 500 MG tablet Take 1 tablet (500 mg total) by mouth 2 (two) times daily. 05/25/17   Eulah Pont, MD  Melatonin 5 MG CHEW Chew 5 mg by mouth daily as needed (for sleep).    [provider]  metoprolol succinate (TOPROL-XL) 100 MG 24 hr tablet Take 100 mg by mouth daily. Take with or immediately following a meal.    [provider]  triamterene-hydrochlorothiazide (MAXZIDE) 75-50 MG tablet Take 0.5 tablets by mouth daily. 04/30/17   [provider]     Physical Exam:  Vitals:   06/09/18 0948  BP: (!) 165/79  Pulse: (!) 114  Resp: 17  Temp: 97.9 F (36.6 C)  TempSrc: Oral  SpO2: 91%  Height: 5\' 9"  (1.753 m)   Constitutional: NAD, calm, comfortable, appears pale, very thin appearing Eyes: PERRL, lids and conjunctivae pale ENMT: Mucous membranes are moist, without exudate or lesions  Neck: normal, supple, no masses, no thyromegaly Respiratory: clear to auscultation bilaterally, no wheezing, no crackles. Normal respiratory effort  Cardiovascular: Tachycardic rate and rhythm, very soft 1 out of 6 murmur, rubs or gallops. No extremity edema. 2+ pedal pulses. No carotid bruits.  Abdomen: Soft, non tender, No hepatosplenomegaly. Bowel sounds positive.  Musculoskeletal: no clubbing / cyanosis. Moves all extremities Skin: no jaundice, No lesions.  Pale Neurologic: Sensation intact  Strength equal in all extremities Psychiatric:   Alert and oriented x 3. Normal mood.     Labs on Admission: I have personally reviewed following labs and imaging studies  CBC: Recent Labs  Lab 06/09/18 1005    WBC 6.7  HGB 4.1*  HCT 15.6*  MCV 74.3*  PLT 306    Basic Metabolic Panel: Recent Labs  Lab 06/09/18 1005  NA 131*  K 3.6  CL 100  CO2 22  GLUCOSE 95  BUN 19  CREATININE 2.09*  CALCIUM 8.3*    GFR: CrCl cannot be calculated (Unknown ideal weight.).  Liver Function Tests: Recent Labs  Lab 06/09/18 1005  AST  93*  ALT 27  ALKPHOS 72  BILITOT 0.5  PROT 8.3*  ALBUMIN 2.3*   No results for input(s): LIPASE, AMYLASE in the last 168 hours. No results for input(s): AMMONIA in the last 168 hours.  Coagulation Profile: Recent Labs  Lab 06/09/18 1228  INR 1.11    Cardiac Enzymes: No results for input(s): CKTOTAL, CKMB, CKMBINDEX, TROPONINI in the last 168 hours.  BNP (last 3 results) No results for input(s): PROBNP in the last 8760 hours.  HbA1C: No results for input(s): HGBA1C in the last 72 hours.  CBG: No results for input(s): GLUCAP in the last 168 hours.  Lipid Profile: No results for input(s): CHOL, HDL, LDLCALC, TRIG, CHOLHDL, LDLDIRECT in the last 72 hours.  Thyroid Function Tests: No results for input(s): TSH, T4TOTAL, FREET4, T3FREE, THYROIDAB in the last 72 hours.  Anemia Panel: Recent Labs    06/09/18 1228  RETICCTPCT 3.4*    Urine analysis:    Component Value Date/Time   COLORURINE YELLOW 05/24/2017 0821   APPEARANCEUR CLEAR 05/24/2017 0821   LABSPEC 1.009 05/24/2017 0821   PHURINE 5.0 05/24/2017 0821   GLUCOSEU NEGATIVE 05/24/2017 0821   HGBUR SMALL (A) 05/24/2017 0821   BILIRUBINUR NEGATIVE 05/24/2017 0821   KETONESUR NEGATIVE 05/24/2017 0821   PROTEINUR 100 (A) 05/24/2017 0821   NITRITE NEGATIVE 05/24/2017 0821   LEUKOCYTESUR NEGATIVE 05/24/2017 0821    Sepsis Labs: @LABRCNTIP (procalcitonin:4,lacticidven:4) )No results found for this or any previous visit (from the past 240 hour(s)).   Radiological Exams on Admission: No results found.  EKG: Independently reviewed.  Assessment/Plan Active Problems:   Paroxysmal  atrial fibrillation (HCC)   Hyponatremia   Chronic viral hepatitis C (HCC)   Severe anemia   CKD (chronic kidney disease)   Symptomatic severe anemia, likely of iron deficiency, in the setting of significant NSAIDs intake, and history of CKD.  Of note, the patient never has been seen by GI, had any endoscopy in the past.  One year ago, his last hemoglobin was recorded at 10.6 per EDP, digital exam was negative for bloody stool, Hemoccult negative.Hemoglobin on presentation was 4.1, with hematocrit of 15.6, and an MCV of 74.3.  Anemia panel is pending.  Reticulocyte count is at 82.  Patient is not on blood thinners, and does not show acute areas of bleeding.  He does have a history of CKD.  He also takes a significant amount of NSAIDs for pain. Admit to telemetry inpatient CBC every 6 hours Agree with 3 units of blood, that they were initiated in the ED Pepcid IV twice daily due to shortage of Protonix Hold all NSAIDs Will await for anemia panel results, and hepatic panel, to determine if GI consult is warranted Check TSH Oxygen as needed.  Acute on Chronic CKD: likely due to  diuretics and  NSAIDS.  Current Cr is 2.09, average is between 1.1 and 1.4 Lab Results  Component Value Date   CREATININE 2.09 (H) 06/09/2018   CREATININE 1.48 (H) 05/25/2017   CREATININE 1.93 (H) 05/24/2017   Hold  diuretics and ACE I, and NSAIDS Gentle IV fluid Follow BMP daily   Paroxysmal Atrial Fibrillation not on anticoagulation.  EKG STach, no other abnormalities   Rate controlled Continue Toprol XL    Hypertension BP   187/91  Pulse  115   Resume home Toprol-XL, in view of no apparent GI bleed, Hemoccult negative   Hyponatremia chronic likely due to diuretics and beer pottomania current sodium at 131, essentially at baseline  patient is asymptomatic for this.  hold HCTZ -Gentle IV Normal saline Consider SIADH w/u if values do not improve   Repeat BMET today  Chronic hepatitis C, not on meds.  No  bleeding issues. PT 14.2, INR 1.11, PTT 33.   Bilirubin 0.5.  Last hep C quantitative was 1.210, 000, with H CV RNA at 6.083.  This is being followed by Dr. Su Hilt, calmer infectious disease, last seen in November 2018 Follow hepatitis panel Follow with ID as OP   Alcohol abuse, ETOH pending Follow results  CIWA   History of Seizures likely due to above , on Keppra  Continue present therapy with Keppra   DVT prophylaxis:  SCD  Code Status:    Full  Family Communication:  Discussed with patient Disposition Plan: Expect patient to be discharged to home after condition improves Consults called:    None Admission status:  Tele IP    Marlowe Kays, PA-C [facility]   Amion text  [phone]   06/09/2018, 1:13 PM

## 2018-06-10 ENCOUNTER — Other Ambulatory Visit: Payer: Self-pay

## 2018-06-10 ENCOUNTER — Encounter (HOSPITAL_COMMUNITY): Payer: Self-pay | Admitting: *Deleted

## 2018-06-10 DIAGNOSIS — N183 Chronic kidney disease, stage 3 (moderate): Secondary | ICD-10-CM

## 2018-06-10 DIAGNOSIS — E43 Unspecified severe protein-calorie malnutrition: Secondary | ICD-10-CM

## 2018-06-10 DIAGNOSIS — I480 Paroxysmal atrial fibrillation: Secondary | ICD-10-CM

## 2018-06-10 DIAGNOSIS — E871 Hypo-osmolality and hyponatremia: Secondary | ICD-10-CM

## 2018-06-10 DIAGNOSIS — B182 Chronic viral hepatitis C: Secondary | ICD-10-CM

## 2018-06-10 DIAGNOSIS — D649 Anemia, unspecified: Secondary | ICD-10-CM

## 2018-06-10 LAB — CBC
HCT: 29.9 % — ABNORMAL LOW (ref 39.0–52.0)
HCT: 32.4 % — ABNORMAL LOW (ref 39.0–52.0)
Hemoglobin: 9.2 g/dL — ABNORMAL LOW (ref 13.0–17.0)
Hemoglobin: 9.9 g/dL — ABNORMAL LOW (ref 13.0–17.0)
MCH: 24.3 pg — ABNORMAL LOW (ref 26.0–34.0)
MCH: 24.3 pg — ABNORMAL LOW (ref 26.0–34.0)
MCHC: 30.8 g/dL (ref 30.0–36.0)
MCHC: 30.6 g/dL (ref 30.0–36.0)
MCV: 79.1 fL (ref 78.0–100.0)
MCV: 79.4 fL (ref 78.0–100.0)
PLATELETS: 233 10*3/uL (ref 150–400)
PLATELETS: 251 10*3/uL (ref 150–400)
RBC: 3.78 MIL/uL — ABNORMAL LOW (ref 4.22–5.81)
RBC: 4.08 MIL/uL — AB (ref 4.22–5.81)
RDW: 20.2 % — ABNORMAL HIGH (ref 11.5–15.5)
RDW: 20.5 % — AB (ref 11.5–15.5)
WBC: 6.6 10*3/uL (ref 4.0–10.5)
WBC: 6.7 10*3/uL (ref 4.0–10.5)

## 2018-06-10 LAB — BPAM RBC
BLOOD PRODUCT EXPIRATION DATE: 201907282359
Blood Product Expiration Date: 201907272359
Blood Product Expiration Date: 201907282359
ISSUE DATE / TIME: 201906302218
ISSUE DATE / TIME: 201906301446
ISSUE DATE / TIME: 201906301747
UNIT TYPE AND RH: 5100
Unit Type and Rh: 5100
Unit Type and Rh: 5100

## 2018-06-10 LAB — TYPE AND SCREEN
ABO/RH(D): O POS
ANTIBODY SCREEN: NEGATIVE
UNIT DIVISION: 0
Unit division: 0
Unit division: 0

## 2018-06-10 LAB — BASIC METABOLIC PANEL
Anion gap: 5 (ref 5–15)
BUN: 22 mg/dL (ref 8–23)
CHLORIDE: 102 mmol/L (ref 98–111)
CO2: 24 mmol/L (ref 22–32)
CREATININE: 2.04 mg/dL — AB (ref 0.61–1.24)
Calcium: 8.1 mg/dL — ABNORMAL LOW (ref 8.9–10.3)
GFR, EST AFRICAN AMERICAN: 38 mL/min — AB (ref >60)
GFR, EST NON AFRICAN AMERICAN: 33 mL/min — AB (ref >60)
Glucose, Bld: 94 mg/dL (ref 70–99)
Potassium: 4 mmol/L (ref 3.5–5.1)
SODIUM: 131 mmol/L — AB (ref 135–145)

## 2018-06-10 LAB — HIV ANTIBODY (ROUTINE TESTING W REFLEX): HIV SCREEN 4TH GENERATION: NONREACTIVE

## 2018-06-10 MED ORDER — SODIUM CHLORIDE 0.9 % IV SOLN
510.0000 mg | Freq: Once | INTRAVENOUS | Status: AC
  Administered 2018-06-10: 510 mg via INTRAVENOUS
  Filled 2018-06-10 (×3): qty 17

## 2018-06-10 MED ORDER — GUAIFENESIN-DM 100-10 MG/5ML PO SYRP
5.0000 mL | ORAL_SOLUTION | ORAL | Status: DC | PRN
  Administered 2018-06-10: 5 mL via ORAL
  Filled 2018-06-10: qty 5

## 2018-06-10 MED ORDER — PANTOPRAZOLE SODIUM 20 MG PO TBEC: 20.0000 mg | DELAYED_RELEASE_TABLET | Freq: Every day | ORAL | 0 refills | Status: AC

## 2018-06-10 MED ORDER — FEROSUL 325 (65 FE) MG PO TABS: 325.0000 mg | ORAL_TABLET | Freq: Three times a day (TID) | ORAL | 0 refills | Status: AC

## 2018-06-10 MED ORDER — ENSURE ENLIVE PO LIQD: 237.0000 mL | Freq: Three times a day (TID) | ORAL | Status: DC

## 2018-06-10 MED ORDER — HYDRALAZINE HCL 20 MG/ML IJ SOLN: 5.0000 mg | Freq: Four times a day (QID) | INTRAMUSCULAR | Status: DC | PRN

## 2018-06-10 MED ORDER — FUROSEMIDE 10 MG/ML IJ SOLN
40.0000 mg | Freq: Once | INTRAMUSCULAR | Status: AC
Start: 2018-06-10 — End: 2018-06-10
  Administered 2018-06-10: 40 mg via INTRAVENOUS
  Filled 2018-06-10: qty 4

## 2018-06-10 NOTE — Discharge Summary (Addendum)
Physician Discharge Summary  Joseph MandrilRicardo F Billard ZOX:[id number] DOB: 04/13/1956 DOA: 06/09/2018  PCP: Grayce SessionsEdwards, Michelle P, NP  Admit date: 06/09/2018 Discharge date: 06/10/2018  Admitted From: Home Disposition: Home   Recommendations for Outpatient Follow-up:  1. Follow up with PCP in 1-2 weeks. With chronic cough, would consider CT chest.  2. Follow up with [facility] in the next 1-2 weeks for further evaluation for possible GI bleed, r/o malignancy.  3. Consider replacing HCTZ in BP regimen as well as nephrology referral if CrCl remains low.  4. Continue po iron TID as sent to pharmacy by PCP.  Home Health: None Equipment/Devices: None Discharge Condition: Stable CODE STATUS: Full Diet recommendation: Heart healthy  Brief/Interim Summary: Joseph Nash is a 62 y.o. male with a history of tobacco use, alcohol abuse, stage III CKD, PAF no longer on anticoagulation after fall with SDH 2018 who presented to the ED directed by PCP after routine lab work revealed low hemoglobin. This was confirmed to be 4.1g/dl on arrival with further work up confirming iron deficiency. He reported dyspnea on exertion and chronic cough as well as roughly a 30lbs unintentional weight loss over the past year. Denied any noticeable bleeding, GI or otherwise. 2u PRBCs given with improvement in hemoglobin and IV iron ordered. Initial FOBT was negative. CT abdomen performed as initial evaluation for occult malignancy was negative. [facility], Dr. Evette CristalGanem, was consulted for advice regarding further GI work up. He recommended outpatient follow up in the next 1-2 weeks. Patient was intensively counseled to follow up there and with PCP as I am concerned he has an occult malignancy with significant unintentional weight loss and +smoking history, +FH.    Discharge Diagnoses:  Active Problems:   Paroxysmal atrial fibrillation (HCC)   Hyponatremia   Chronic viral hepatitis C (HCC)   Severe anemia   CKD (chronic kidney disease)    Protein-calorie malnutrition, severe  Severe iron deficiency anemia: Suspect chronic blood loss, possibly GI source. INR 1.11, not on anticoagulation. Had FOBT testing as colon CA screening once which was per pt report negative. No endoscopic evaluations. FOBT here negative. - Monitor hgb at follow up, s/p 3u PRBCs 6/30 hgb up to 9.9.  - On empiric PPI - Avoid NSAIDs, anticoagulation  Unintentional weight loss, severe protein calorie malnutrition: With long history of tobacco use and +FH of lung CA in mother. No red flag symptoms like dysphagia, odynophagia, globus sensation, or gross GI bleeding.  - Dietitian consulted, education provided - Initial CT abdomen without evidence of mass, but still concerned with need to r/o malignancy.  Stage III CKD: Uncertain of baseline creatinine, though with trend from 2016 (1.1) to 2018 (1.4 - 2.4), I suspect creatinine of 2.04/2.09 which was stable even after transfusions is baseline.  - Needs close PCP follow up, possible nephrology referral and improved risk factor modification.   Alcohol use: Reports ~ 1 beer per day.  - No evidence of acute intoxication or withdrawal while here.  - Counseling provided.   Chronic hepatitis C: Not treated. No bleeding issues. PT 14.2, INR 1.11, PTT 33.   Bilirubin 0.5.  Last hep C quantitative was 1.210, 000, with H CV RNA at 6.083.   - Follow up with his ID doctor as outpatient, last seen in November 2018  History of SDH:  - No anticoagulation with AFib - DC keppra as this was just ppx around the time of incident.   PAF: Sinus while admitted.  - Not anticoagulated due to  SDH   Discharge Instructions Discharge Instructions    Discharge instructions   Complete by:  As directed    You were admitted for profound anemia without a source of bleeding idtenified. Your blood counts have risen appropriately with transfusions and you received IV iron. A GI doctor was consulted and has recommended that you follow up in  the clinic for further work up to rule out cancer or other source of bleeding. You are stable for discharge with the following recommendations:  - STOP taking NSAIDs which include ibuprofen, motrin, advil, goody's, BC powder, aleve, naproxen. You may take tylenol as needed for pains.  - STOP taking aspirin until you follow up - Take the iron pills three times per day as directed - Start taking protonix daily to decrease acid in your stomach which may be causing a bleed. - Follow up with your primary care provider for further evaluation of unintentional weight loss. You may need further imaging to rule out cancer. - Follow up with [facility] gastroenterology soon. A new patient referral was sent to them prior to discharge. They will follow up with you. Please call if you don't hear from them in the next few days. - If you experience chest pain, trouble breathing, lightheadedness, dizziness, palpitations, bleeding or any other worrisome symptoms, please seek medical attention right away.     Allergies as of 06/10/2018   No Known Allergies     Medication List    STOP taking these medications   aspirin EC 81 MG tablet   diclofenac 75 MG EC tablet Commonly known as:  VOLTAREN   ibuprofen 600 MG tablet Commonly known as:  ADVIL,MOTRIN   levETIRAcetam 500 MG tablet Commonly known as:  KEPPRA   triamterene-hydrochlorothiazide 75-50 MG tablet Commonly known as:  MAXZIDE     TAKE these medications   albuterol 108 (90 Base) MCG/ACT inhaler Commonly known as:  PROVENTIL HFA;VENTOLIN HFA Inhale 1 puff into the lungs every 6 (six) hours as needed for shortness of breath or wheezing.   FEROSUL 325 (65 FE) MG tablet Generic drug:  ferrous sulfate Take 1 tablet (325 mg total) by mouth 3 (three) times daily.   losartan-hydrochlorothiazide 50-12.5 MG tablet Commonly known as:  HYZAAR Take 1 tablet by mouth daily.   Magnesium Oxide 400 (240 Mg) MG Tabs Take 1 tablet by mouth daily.   Melatonin 5  MG Chew Chew 5 mg by mouth daily as needed (for sleep).   metoprolol succinate 100 MG 24 hr tablet Commonly known as:  TOPROL-XL Take 100 mg by mouth daily. Take with or immediately following a meal.   pantoprazole 20 MG tablet Commonly known as:  PROTONIX Take 1 tablet (20 mg total) by mouth daily.      Follow-up Information    Grayce Sessions, NP. Schedule an appointment as soon as possible for a visit in 1 week(s).   Specialty:  Internal Medicine Contact information: [address] [place] [address] [phone]        Graylin Shiver, MD. Schedule an appointment as soon as possible for a visit.   Specialty:  Gastroenterology Why:  schedule as soon as possible for further GI work up. Contact information: [address] [place] [address] [phone]          No Known Allergies  Consultations:  [facility], Dr. Evette Cristal  Procedures/Studies: Ct Abdomen Pelvis Wo Contrast  Result Date: 06/09/2018 CLINICAL DATA:  Asymptomatic anemia. EXAM: CT ABDOMEN AND  PELVIS WITHOUT CONTRAST TECHNIQUE: Multidetector CT imaging of the abdomen and pelvis was performed following the standard protocol without IV contrast. COMPARISON:  Renal ultrasound 05/24/2017 FINDINGS: Lower chest: Bilateral moderate-to-large pleural effusions. Bibasilar compressive atelectasis. Enlarged heart. Hepatobiliary: Normal nonenhanced appearance of the liver. Two gallstones within the partially decompressed gallbladder, the largest measures 8 mm. Pancreas: Unremarkable. No pancreatic ductal dilatation or surrounding inflammatory changes. Spleen: Normal in size without focal abnormality. Adrenals/Urinary Tract: Adrenal glands are unremarkable. Kidneys are without focal lesion, or hydronephrosis. Nonobstructive calculus in the upper pole the left kidney measures 4 mm. Bladder is unremarkable. Stomach/Bowel: Stomach is within normal limits. Appendix appears normal. No evidence of bowel  wall thickening, distention, or inflammatory changes. Vascular/Lymphatic: Aortic atherosclerosis. No enlarged abdominal or pelvic lymph nodes. Reproductive: Prostate is unremarkable. Other: No abdominal wall hernia or abnormality. No abdominopelvic ascites. Musculoskeletal: Healed right 12 rib and L3 right lateral mass fractures. IMPRESSION: Bilateral moderate to large pleural effusions with dependent atelectasis of the bilateral lung bases. No acute abnormalities within the abdomen or pelvis. Advanced calcific atherosclerotic disease of the aorta. Cholelithiasis. Nonobstructive left nephrolithiasis. Electronically Signed   By: Ted Mcalpine M.D.   On: 06/09/2018 17:01   Dg Chest 2 View  Result Date: 06/09/2018 CLINICAL DATA:  Anemia. EXAM: CHEST - 2 VIEW COMPARISON:  May 24, 2017 FINDINGS: There is an effusion with underlying opacity in the left base which is worsened since 01/24/2017. Mild edema. Mild cardiomegaly. The hila and mediastinum are unremarkable. No pneumothorax. No other interval changes. IMPRESSION: 1. Mild pulmonary edema. 2. Focal infiltrate and effusion in the left base. Recommend follow-up to resolution. Electronically Signed   By: Gerome Sam III M.D   On: 06/09/2018 13:40     Subjective: Feels fine, still with chronic cough, nonproductive, no dyspnea or chest pain. No bleeding. No abdominal pain, ate good breakfast but didn't want to eat lunch. Niece at bedside concerned over him no longer eating much.   Discharge Exam: Vitals:   06/10/18 0439 06/10/18 1402  BP: (!) 164/96 (!) 162/101  Pulse: 86 90  Resp: 18 16  Temp: 97.6 F (36.4 C) 97.9 F (36.6 C)  SpO2: 98% 94%   General: Thin male in no distress Cardiovascular: RRR, S1/S2 +, no rubs, no gallops Respiratory: CTA bilaterally, no wheezing, no rhonchi Abdominal: Soft, NT, ND, bowel sounds + Extremities: No edema, no cyanosis  Labs: BNP (last 3 results) No results for input(s): BNP in the last 8760  hours. Basic Metabolic Panel: Recent Labs  Lab 06/09/18 1005 06/10/18 0328  NA 131* 131*  K 3.6 4.0  CL 100 102  CO2 22 24  GLUCOSE 95 94  BUN 19 22  CREATININE 2.09* 2.04*  CALCIUM 8.3* 8.1*   Liver Function Tests: Recent Labs  Lab 06/09/18 1005 06/09/18 1356  AST 93* 61*  ALT 27 25  ALKPHOS 72 75  BILITOT 0.5 0.6  PROT 8.3* 8.2*  ALBUMIN 2.3* 2.3*   No results for input(s): LIPASE, AMYLASE in the last 168 hours. No results for input(s): AMMONIA in the last 168 hours. CBC: Recent Labs  Lab 06/09/18 1005 06/09/18 1356 06/10/18 0328 06/10/18 0924  WBC 6.7 6.5 6.6 6.7  HGB 4.1* 4.2* 9.2* 9.9*  HCT 15.6* 15.5* 29.9* 32.4*  MCV 74.3* 73.1* 79.1 79.4  PLT 306 286 233 251   Cardiac Enzymes: No results for input(s): CKTOTAL, CKMB, CKMBINDEX, TROPONINI in the last 168 hours. BNP: Invalid input(s): POCBNP CBG: No results for input(s): GLUCAP  in the last 168 hours. D-Dimer No results for input(s): DDIMER in the last 72 hours. Hgb A1c No results for input(s): HGBA1C in the last 72 hours. Lipid Profile No results for input(s): CHOL, HDL, LDLCALC, TRIG, CHOLHDL, LDLDIRECT in the last 72 hours. Thyroid function studies Recent Labs    06/09/18 1356  TSH 2.494   Anemia work up Recent Labs    06/09/18 1228  VITAMINB12 347  FOLATE 12.4  FERRITIN 15*  TIBC 547*  IRON 20*  RETICCTPCT 3.4*   Urinalysis    Component Value Date/Time   COLORURINE YELLOW 05/24/2017 0821   APPEARANCEUR CLEAR 05/24/2017 0821   LABSPEC 1.009 05/24/2017 0821   PHURINE 5.0 05/24/2017 0821   GLUCOSEU NEGATIVE 05/24/2017 0821   HGBUR SMALL (A) 05/24/2017 0821   BILIRUBINUR NEGATIVE 05/24/2017 0821   KETONESUR NEGATIVE 05/24/2017 0821   PROTEINUR 100 (A) 05/24/2017 0821   NITRITE NEGATIVE 05/24/2017 0821   LEUKOCYTESUR NEGATIVE 05/24/2017 0821    Microbiology No results found for this or any previous visit (from the past 240 hour(s)).  Time coordinating discharge:  Approximately 40 minutes  Tyrone Nine, MD  [facility] 06/10/2018, 5:20 PM Pager [phone]

## 2018-06-10 NOTE — Progress Notes (Signed)
Initial Nutrition Assessment  DOCUMENTATION CODES:   Severe malnutrition in context of chronic illness, Underweight  INTERVENTION:   -Ensure Enlive po TID, each supplement provides 350 kcal and 20 grams of protein -Magic Cup TID with meals, each supplement provides 290 kcals and 9 grams protein -Continue MVI with minerals daily  NUTRITION DIAGNOSIS:   Severe Malnutrition related to chronic illness(chronic hepatitis, ETOH abuse) as evidenced by energy intake < or equal to 75% for > or equal to 1 month, moderate fat depletion, severe fat depletion, moderate muscle depletion, severe muscle depletion.  GOAL:   Patient will meet greater than or equal to 90% of their needs  MONITOR:   PO intake, Supplement acceptance, Labs, Weight trends, Skin, I & O's  REASON FOR ASSESSMENT:   Malnutrition Screening Tool    ASSESSMENT:   Joseph Nash is a 62 y.o. male with a history of alcohol abuse, hypertension, chronic hepatitis, remote seizures,  CKD, brought to the emergency department, after his primary care physician called him with abnormal labs, for a hemoglobin of 4.1,The patient denies any recent illnesses, or dark stools or history of GI bleed.  Pt admitted with severe anemia. Per H&P, concern for possible GI malignancy.   Case discussed with RN prior to visit, who endorses pt with poor appetite and weight loss (approximately 30# over the past year). Pt has only been taking bites and sips at meals. Per RN, pt has a family members who assists with his care.   Spoke with pt at bedside, who provided vague history. He endorses poor appetite and weight loss. He is unsure of his UBW, how much weight he has lost, of when weight loss started occurring. He shares he usually consumes 3 meals per day; he typically goes out to eat at [employer] his food preferences vary greatly day to day per his report. He shares his appetite has gotten much worse over the past 2 weeks as he no longer has  a desire to eat. It has also been more difficult for him to chew food, due to weakness.   Reviewed wt hx; pt has experienced a 14.3% wt loss over the past year, which while not significant for time frame is concerning given ETOH abuse and hx of poor oral intake.   Discussed with pt importance of good meal intake to promote healing. Noted pt consumed only a few bites of sandwich and 100% of jello off lunch tray. Offered to downgrade diet for ease of intake, however, pt refused; he reports that he would prefer to choose foods off of the menu that he feels like he can tolerate due to his erratic food preferences. Pt is amenable to Ensure supplements (chocolate flavor).   Medications reviewed and include folic acid, MVI with minerals, and thiamine.   Labs reviewed: Na: 131  NUTRITION - FOCUSED PHYSICAL EXAM:    Most Recent Value  Orbital Region  Severe depletion  Upper Arm Region  Severe depletion  Thoracic and Lumbar Region  Moderate depletion  Buccal Region  Moderate depletion  Temple Region  Severe depletion  Clavicle Bone Region  Mild depletion  Clavicle and Acromion Bone Region  Mild depletion  Scapular Bone Region  Moderate depletion  Dorsal Hand  Moderate depletion  Patellar Region  Severe depletion  Anterior Thigh Region  Severe depletion  Posterior Calf Region  Severe depletion  Edema (RD Assessment)  None  Hair  Reviewed  Eyes  Reviewed  Mouth  Reviewed  Skin  Reviewed  Nails  Reviewed       Diet Order:   Diet Order           Diet regular Room service appropriate? Yes; Fluid consistency: Thin  Diet effective now          EDUCATION NEEDS:   Education needs have been addressed  Skin:  Skin Assessment: Reviewed RN Assessment  Last BM:  06/06/18  Height:   Ht Readings from Last 1 Encounters:  06/09/18 5\' 9"  (1.753 m)    Weight:   Wt Readings from Last 1 Encounters:  06/10/18 113 lb 1.5 oz (51.3 kg)    Ideal Body Weight:  72.7 kg  BMI:  Body mass index  is 16.7 kg/m.  Estimated Nutritional Needs:   Kcal:  1800-2000  Protein:  100-115 grams  Fluid:  1.8-2.1 L    Joseph Nash, RD, LDN, CDE Pager: [phone] After hours Pager: [phone]

## 2018-06-27 ENCOUNTER — Other Ambulatory Visit (HOSPITAL_COMMUNITY): Payer: BLUE CROSS/BLUE SHIELD

## 2018-06-27 ENCOUNTER — Emergency Department (HOSPITAL_COMMUNITY): Payer: BLUE CROSS/BLUE SHIELD

## 2018-06-27 ENCOUNTER — Encounter (HOSPITAL_COMMUNITY): Payer: Self-pay | Admitting: Emergency Medicine

## 2018-06-27 ENCOUNTER — Other Ambulatory Visit: Payer: Self-pay

## 2018-06-27 ENCOUNTER — Inpatient Hospital Stay (HOSPITAL_COMMUNITY)
Admission: EM | Admit: 2018-06-27 | Discharge: 2018-07-03 | DRG: 291 | Disposition: A | Discharge status: Home / Self Care | Payer: BLUE CROSS/BLUE SHIELD | Attending: Internal Medicine | Admitting: Internal Medicine

## 2018-06-27 DIAGNOSIS — Z79899 Other long term (current) drug therapy: Secondary | ICD-10-CM

## 2018-06-27 DIAGNOSIS — N189 Chronic kidney disease, unspecified: Secondary | ICD-10-CM | POA: Diagnosis present

## 2018-06-27 DIAGNOSIS — F1010 Alcohol abuse, uncomplicated: Secondary | ICD-10-CM | POA: Diagnosis present

## 2018-06-27 DIAGNOSIS — E876 Hypokalemia: Secondary | ICD-10-CM | POA: Diagnosis present

## 2018-06-27 DIAGNOSIS — D509 Iron deficiency anemia, unspecified: Secondary | ICD-10-CM | POA: Diagnosis present

## 2018-06-27 DIAGNOSIS — Z789 Other specified health status: Secondary | ICD-10-CM | POA: Diagnosis not present

## 2018-06-27 DIAGNOSIS — E43 Unspecified severe protein-calorie malnutrition: Secondary | ICD-10-CM | POA: Diagnosis present

## 2018-06-27 DIAGNOSIS — I10 Essential (primary) hypertension: Secondary | ICD-10-CM | POA: Diagnosis not present

## 2018-06-27 DIAGNOSIS — I361 Nonrheumatic tricuspid (valve) insufficiency: Secondary | ICD-10-CM | POA: Diagnosis not present

## 2018-06-27 DIAGNOSIS — J441 Chronic obstructive pulmonary disease with (acute) exacerbation: Secondary | ICD-10-CM | POA: Diagnosis present

## 2018-06-27 DIAGNOSIS — J9809 Other diseases of bronchus, not elsewhere classified: Secondary | ICD-10-CM | POA: Diagnosis present

## 2018-06-27 DIAGNOSIS — I5033 Acute on chronic diastolic (congestive) heart failure: Secondary | ICD-10-CM | POA: Diagnosis present

## 2018-06-27 DIAGNOSIS — J440 Chronic obstructive pulmonary disease with acute lower respiratory infection: Secondary | ICD-10-CM | POA: Diagnosis present

## 2018-06-27 DIAGNOSIS — R9389 Abnormal findings on diagnostic imaging of other specified body structures: Secondary | ICD-10-CM | POA: Diagnosis not present

## 2018-06-27 DIAGNOSIS — N179 Acute kidney failure, unspecified: Secondary | ICD-10-CM | POA: Diagnosis present

## 2018-06-27 DIAGNOSIS — J209 Acute bronchitis, unspecified: Secondary | ICD-10-CM | POA: Diagnosis not present

## 2018-06-27 DIAGNOSIS — R627 Adult failure to thrive: Secondary | ICD-10-CM | POA: Diagnosis present

## 2018-06-27 DIAGNOSIS — I5031 Acute diastolic (congestive) heart failure: Secondary | ICD-10-CM | POA: Diagnosis not present

## 2018-06-27 DIAGNOSIS — B182 Chronic viral hepatitis C: Secondary | ICD-10-CM | POA: Diagnosis present

## 2018-06-27 DIAGNOSIS — Z7951 Long term (current) use of inhaled steroids: Secondary | ICD-10-CM

## 2018-06-27 DIAGNOSIS — I130 Hypertensive heart and chronic kidney disease with heart failure and stage 1 through stage 4 chronic kidney disease, or unspecified chronic kidney disease: Principal | ICD-10-CM | POA: Diagnosis present

## 2018-06-27 DIAGNOSIS — Z681 Body mass index (BMI) 19 or less, adult: Secondary | ICD-10-CM | POA: Diagnosis not present

## 2018-06-27 DIAGNOSIS — D649 Anemia, unspecified: Secondary | ICD-10-CM | POA: Diagnosis present

## 2018-06-27 DIAGNOSIS — R0600 Dyspnea, unspecified: Secondary | ICD-10-CM

## 2018-06-27 DIAGNOSIS — J9811 Atelectasis: Secondary | ICD-10-CM | POA: Diagnosis present

## 2018-06-27 DIAGNOSIS — E871 Hypo-osmolality and hyponatremia: Secondary | ICD-10-CM | POA: Diagnosis present

## 2018-06-27 DIAGNOSIS — J181 Lobar pneumonia, unspecified organism: Secondary | ICD-10-CM | POA: Diagnosis present

## 2018-06-27 DIAGNOSIS — I480 Paroxysmal atrial fibrillation: Secondary | ICD-10-CM | POA: Diagnosis present

## 2018-06-27 DIAGNOSIS — R918 Other nonspecific abnormal finding of lung field: Secondary | ICD-10-CM

## 2018-06-27 DIAGNOSIS — J90 Pleural effusion, not elsewhere classified: Secondary | ICD-10-CM | POA: Diagnosis present

## 2018-06-27 DIAGNOSIS — F17210 Nicotine dependence, cigarettes, uncomplicated: Secondary | ICD-10-CM | POA: Diagnosis present

## 2018-06-27 DIAGNOSIS — N183 Chronic kidney disease, stage 3 (moderate): Secondary | ICD-10-CM | POA: Diagnosis present

## 2018-06-27 HISTORY — DX: Chronic kidney disease, stage 3 (moderate): N18.3

## 2018-06-27 HISTORY — DX: Chronic kidney disease, stage 3 unspecified: N18.30

## 2018-06-27 HISTORY — DX: Traumatic subdural hemorrhage with loss of consciousness of unspecified duration, initial encounter: S06.5X9A

## 2018-06-27 HISTORY — DX: Chronic viral hepatitis C: B18.2

## 2018-06-27 HISTORY — DX: Hypo-osmolality and hyponatremia: E87.1

## 2018-06-27 HISTORY — DX: Traumatic subdural hemorrhage with loss of consciousness status unknown, initial encounter: S06.5XAA

## 2018-06-27 HISTORY — DX: Anemia, unspecified: D64.9

## 2018-06-27 LAB — COMPREHENSIVE METABOLIC PANEL
ALT: 45 U/L — AB (ref 0–44)
AST: 119 U/L — AB (ref 15–41)
Albumin: 2.3 g/dL — ABNORMAL LOW (ref 3.5–5.0)
Alkaline Phosphatase: 82 U/L (ref 38–126)
Anion gap: 9 (ref 5–15)
BILIRUBIN TOTAL: 0.6 mg/dL (ref 0.3–1.2)
BUN: 22 mg/dL (ref 8–23)
CO2: 22 mmol/L (ref 22–32)
CREATININE: 1.98 mg/dL — AB (ref 0.61–1.24)
Calcium: 8.2 mg/dL — ABNORMAL LOW (ref 8.9–10.3)
Chloride: 102 mmol/L (ref 98–111)
GFR calc non Af Amer: 34 mL/min — ABNORMAL LOW (ref >60)
GFR, EST AFRICAN AMERICAN: 40 mL/min — AB (ref >60)
Glucose, Bld: 76 mg/dL (ref 70–99)
POTASSIUM: 3.2 mmol/L — AB (ref 3.5–5.1)
Sodium: 133 mmol/L — ABNORMAL LOW (ref 135–145)
TOTAL PROTEIN: 8.1 g/dL (ref 6.5–8.1)

## 2018-06-27 LAB — CBC
HEMATOCRIT: 38.8 % — AB (ref 39.0–52.0)
Hemoglobin: 11.8 g/dL — ABNORMAL LOW (ref 13.0–17.0)
MCH: 26.2 pg (ref 26.0–34.0)
MCHC: 30.4 g/dL (ref 30.0–36.0)
MCV: 86 fL (ref 78.0–100.0)
Platelets: 276 10*3/uL (ref 150–400)
RBC: 4.51 MIL/uL (ref 4.22–5.81)
RDW: 25.8 % — AB (ref 11.5–15.5)
WBC: 6 10*3/uL (ref 4.0–10.5)

## 2018-06-27 LAB — BRAIN NATRIURETIC PEPTIDE: B Natriuretic Peptide: >4500 pg/mL — ABNORMAL HIGH (ref 0.0–100.0)

## 2018-06-27 LAB — I-STAT TROPONIN, ED: Troponin i, poc: 0.07 ng/mL (ref 0.00–0.08)

## 2018-06-27 MED ORDER — HYDRALAZINE HCL 20 MG/ML IJ SOLN
10.0000 mg | Freq: Four times a day (QID) | INTRAMUSCULAR | Status: DC | PRN
  Administered 2018-06-27 (×2): 10 mg via INTRAVENOUS
  Filled 2018-06-27 (×3): qty 1

## 2018-06-27 MED ORDER — POTASSIUM CHLORIDE CRYS ER 20 MEQ PO TBCR
20.0000 meq | EXTENDED_RELEASE_TABLET | Freq: Every day | ORAL | Status: DC
  Administered 2018-06-27 – 2018-06-28 (×2): 20 meq via ORAL
  Filled 2018-06-27 (×2): qty 1

## 2018-06-27 MED ORDER — MAGNESIUM OXIDE 400 (241.3 MG) MG PO TABS
400.0000 mg | ORAL_TABLET | Freq: Every day | ORAL | Status: DC
  Administered 2018-06-27 – 2018-07-03 (×7): 400 mg via ORAL
  Filled 2018-06-27 (×7): qty 1

## 2018-06-27 MED ORDER — PANTOPRAZOLE SODIUM 20 MG PO TBEC
20.0000 mg | DELAYED_RELEASE_TABLET | Freq: Every day | ORAL | Status: DC
  Administered 2018-06-27 – 2018-06-30 (×4): 20 mg via ORAL
  Filled 2018-06-27 (×4): qty 1

## 2018-06-27 MED ORDER — ENSURE ENLIVE PO LIQD
237.0000 mL | Freq: Two times a day (BID) | ORAL | Status: DC
  Administered 2018-06-27 – 2018-06-28 (×3): 237 mL via ORAL

## 2018-06-27 MED ORDER — SODIUM CHLORIDE 0.9 % IV SOLN
INTRAVENOUS | Status: DC
  Administered 2018-06-27: 20 mL/h via INTRAVENOUS
  Administered 2018-07-01: 14:00:00 via INTRAVENOUS

## 2018-06-27 MED ORDER — FUROSEMIDE 10 MG/ML IJ SOLN
20.0000 mg | Freq: Every day | INTRAMUSCULAR | Status: DC
  Administered 2018-06-27 – 2018-06-28 (×2): 20 mg via INTRAVENOUS
  Filled 2018-06-27 (×2): qty 2

## 2018-06-27 MED ORDER — HYDRALAZINE HCL 50 MG PO TABS
50.0000 mg | ORAL_TABLET | Freq: Three times a day (TID) | ORAL | Status: DC
  Administered 2018-06-27 – 2018-07-03 (×17): 50 mg via ORAL
  Filled 2018-06-27 (×8): qty 1
  Filled 2018-06-27: qty 2
  Filled 2018-06-27 (×8): qty 1

## 2018-06-27 MED ORDER — METOPROLOL SUCCINATE ER 100 MG PO TB24
100.0000 mg | ORAL_TABLET | Freq: Every day | ORAL | Status: DC
  Administered 2018-06-27 – 2018-07-03 (×7): 100 mg via ORAL
  Filled 2018-06-27 (×7): qty 1

## 2018-06-27 NOTE — ED Triage Notes (Signed)
Patient states he went to his PCP earlier this week for a cold x5 days and had blood drawn. States he was called by PCP and to go to ED because "he has fluid around his heart". Denies shortness of breath at this time, reports he only feels short of breath when he starts coughing. Non-productive cough. Patient alert, oriented, and ambulating independently with steady gait.

## 2018-06-27 NOTE — ED Provider Notes (Signed)
Joseph Nash   CSN: [id number] Arrival date & time: 06/27/18  7829     History   Chief Complaint Chief Complaint  Patient presents with  . Shortness of Breath    HPI [name] is a 62 y.o. male.  Patient w hx htn, c/o sob in the past week. Gradual onset, moderate, persistent, constant, slowly worse, and gets worse w exertion. Felt tight in chest, but no episodic or exertional chest pain. +foot/ankle swelling. No pnd. Also notes increase in productive cough. No sore throat or nasal congestion. No fever or chills.   The history is provided by the patient.  Shortness of Breath  Associated symptoms include cough. Pertinent negatives include no fever, no headaches, no sore throat, no neck pain, no abdominal pain and no rash.    Past Medical History:  Diagnosis Date  . Atrial fibrillation (HCC)   . Hypertension     Patient Active Problem List   Diagnosis Date Noted  . Protein-calorie malnutrition, severe 06/10/2018  . Severe anemia 06/09/2018  . CKD (chronic kidney disease) 06/09/2018  . Chronic viral hepatitis C (HCC) 05/25/2017  . Paroxysmal atrial fibrillation (HCC) 05/24/2017  . Acute kidney injury (HCC) 05/24/2017  . Hyponatremia 05/24/2017  . Hypertension 05/24/2017  . Intracranial hematoma (HCC) 05/24/2017  . Hematoma 05/24/2017    Past Surgical History:  Procedure Laterality Date  . NO PAST SURGERIES          Home Medications    Prior to Admission medications   Medication Sig Start Date End Date Taking? Authorizing Provider  albuterol (PROVENTIL HFA;VENTOLIN HFA) 108 (90 Base) MCG/ACT inhaler Inhale 1 puff into the lungs every 6 (six) hours as needed for shortness of breath or wheezing. 06/07/18   [provider]  FEROSUL 325 (65 Fe) MG tablet Take 1 tablet (325 mg total) by mouth 3 (three) times daily. 06/10/18   Tyrone Nine, MD  losartan-hydrochlorothiazide (HYZAAR) 50-12.5 MG tablet  Take 1 tablet by mouth daily. 06/07/18   [provider]  Magnesium Oxide 400 (240 Mg) MG TABS Take 1 tablet by mouth daily. 04/01/18   [provider]  Melatonin 5 MG CHEW Chew 5 mg by mouth daily as needed (for sleep).    [provider]  metoprolol succinate (TOPROL-XL) 100 MG 24 hr tablet Take 100 mg by mouth daily. Take with or immediately following a meal.    [provider]  pantoprazole (PROTONIX) 20 MG tablet Take 1 tablet (20 mg total) by mouth daily. 06/10/18   Tyrone Nine, MD    Family History Family History  Problem Relation Age of Onset  . Cancer Mother   . Heart failure Father   . Diabetes Brother     Social History Social History   Tobacco Use  . Smoking status: Current Every Day Smoker    Packs/day: 1.00  . Smokeless tobacco: Never Used  Substance Use Topics  . Alcohol use: Yes    Alcohol/week: 3.6 oz    Types: 6 Cans of beer per week    Comment: DAILY  . Drug use: Yes    Types: Cocaine     Allergies   Patient has no known allergies.   Review of Systems Review of Systems  Constitutional: Negative for fever.  HENT: Negative for sore throat.   Eyes: Negative for redness.  Respiratory: Positive for cough and shortness of breath.   Cardiovascular: Negative for palpitations.  Gastrointestinal: Negative for  abdominal pain.  Genitourinary: Negative for flank pain.  Musculoskeletal: Negative for back pain and neck pain.  Skin: Negative for rash.  Neurological: Negative for headaches.  Hematological: Does not bruise/bleed easily.  Psychiatric/Behavioral: Negative for confusion.     Physical Exam Updated Vital Signs BP (!) 202/118 (BP Location: Right Arm)   Pulse 88   Temp 97.8 F (36.6 C) (Oral)   SpO2 90% Comment: Pulse oximeter has dififculty measuring patient's SpO2  Physical Exam  Constitutional: He appears well-developed and well-nourished.  HENT:  Mouth/Throat: Oropharynx is clear and moist.  Eyes:  Conjunctivae are normal.  Neck: Neck supple. No tracheal deviation present.  Cardiovascular: Normal rate, regular rhythm, normal heart sounds and intact distal pulses.  Pulmonary/Chest: Effort normal. No accessory muscle usage. No respiratory distress.  Decreased bs and rales bases.   Abdominal: Soft. Bowel sounds are normal. He exhibits no distension. There is no tenderness.  Musculoskeletal: He exhibits no tenderness.  bil foot/ankle edema.   Neurological: He is alert.  Skin: Skin is warm and dry. No rash noted. He is not diaphoretic.  Psychiatric: He has a normal mood and affect.  Nursing Nash and vitals reviewed.    ED Treatments / Results  Labs (all labs ordered are listed, but only abnormal results are displayed) Results for orders placed or performed during the hospital encounter of 06/27/18  CBC  Result Value Ref Range   WBC 6.0 4.0 - 10.5 K/uL   RBC 4.51 4.22 - 5.81 MIL/uL   Hemoglobin 11.8 (L) 13.0 - 17.0 g/dL   HCT 84.1 (L) 32.4 - 40.1 %   MCV 86.0 78.0 - 100.0 fL   MCH 26.2 26.0 - 34.0 pg   MCHC 30.4 30.0 - 36.0 g/dL   RDW 02.7 (H) 25.3 - 66.4 %   Platelets 276 150 - 400 K/uL  Comprehensive metabolic panel  Result Value Ref Range   Sodium 133 (L) 135 - 145 mmol/L   Potassium 3.2 (L) 3.5 - 5.1 mmol/L   Chloride 102 98 - 111 mmol/L   CO2 22 22 - 32 mmol/L   Glucose, Bld 76 70 - 99 mg/dL   BUN 22 8 - 23 mg/dL   Creatinine, Ser 4.03 (H) 0.61 - 1.24 mg/dL   Calcium 8.2 (L) 8.9 - 10.3 mg/dL   Total Protein 8.1 6.5 - 8.1 g/dL   Albumin 2.3 (L) 3.5 - 5.0 g/dL   AST 474 (H) 15 - 41 U/L   ALT 45 (H) 0 - 44 U/L   Alkaline Phosphatase 82 38 - 126 U/L   Total Bilirubin 0.6 0.3 - 1.2 mg/dL   GFR calc non Af Amer 34 (L) >60 mL/min   GFR calc Af Amer 40 (L) >60 mL/min   Anion gap 9 5 - 15  I-stat troponin, ED  Result Value Ref Range   Troponin i, poc 0.07 0.00 - 0.08 ng/mL   Comment 3           Ct Abdomen Pelvis Wo Contrast  Result Date: 06/09/2018 CLINICAL DATA:   Asymptomatic anemia. EXAM: CT ABDOMEN AND PELVIS WITHOUT CONTRAST TECHNIQUE: Multidetector CT imaging of the abdomen and pelvis was performed following the standard protocol without IV contrast. COMPARISON:  Renal ultrasound 05/24/2017 FINDINGS: Lower chest: Bilateral moderate-to-large pleural effusions. Bibasilar compressive atelectasis. Enlarged heart. Hepatobiliary: Normal nonenhanced appearance of the liver. Two gallstones within the partially decompressed gallbladder, the largest measures 8 mm. Pancreas: Unremarkable. No pancreatic ductal dilatation or surrounding inflammatory changes. Spleen: Normal in  size without focal abnormality. Adrenals/Urinary Tract: Adrenal glands are unremarkable. Kidneys are without focal lesion, or hydronephrosis. Nonobstructive calculus in the upper pole the left kidney measures 4 mm. Bladder is unremarkable. Stomach/Bowel: Stomach is within normal limits. Appendix appears normal. No evidence of bowel wall thickening, distention, or inflammatory changes. Vascular/Lymphatic: Aortic atherosclerosis. No enlarged abdominal or pelvic lymph nodes. Reproductive: Prostate is unremarkable. Other: No abdominal wall hernia or abnormality. No abdominopelvic ascites. Musculoskeletal: Healed right 12 rib and L3 right lateral mass fractures. IMPRESSION: Bilateral moderate to large pleural effusions with dependent atelectasis of the bilateral lung bases. No acute abnormalities within the abdomen or pelvis. Advanced calcific atherosclerotic disease of the aorta. Cholelithiasis. Nonobstructive left nephrolithiasis. Electronically Signed   By: Ted Mcalpineobrinka  Dimitrova M.D.   On: 06/09/2018 17:01   Dg Chest 2 View  Result Date: 06/27/2018 CLINICAL DATA:  Cough and shortness of breath EXAM: CHEST - 2 VIEW COMPARISON:  June 09, 2018 in May 24, 2017 FINDINGS: There is patchy airspace consolidation in the inferior lingula and portions of the left lower lobe with small left pleural effusion. There is a  minimal right pleural effusion. There are areas of atelectatic change in the right mid lung and right base regions. There is scarring in each apex region, stable. There is a degree of underlying interstitial thickening, potentially a degree of chronic interstitial edema. Heart is upper normal in size with pulmonary vascularity normal. No evident adenopathy. No bone lesions. There are foci of coronary artery calcification. IMPRESSION: Small pleural effusions bilaterally, larger on the left than on the right. Consolidation involving portions of the inferior lingula and left lower lobe. Areas of atelectatic change on the right. Question chronic interstitial edema versus chronic inflammatory change leading to interstitial thickening. Heart remains upper normal in size. No evident adenopathy. There are foci of coronary artery calcification. Electronically Signed   By: Bretta BangWilliam  Woodruff III M.D.   On: 06/27/2018 08:33   Dg Chest 2 View  Result Date: 06/09/2018 CLINICAL DATA:  Anemia. EXAM: CHEST - 2 VIEW COMPARISON:  May 24, 2017 FINDINGS: There is an effusion with underlying opacity in the left base which is worsened since 01/24/2017. Mild edema. Mild cardiomegaly. The hila and mediastinum are unremarkable. No pneumothorax. No other interval changes. IMPRESSION: 1. Mild pulmonary edema. 2. Focal infiltrate and effusion in the left base. Recommend follow-up to resolution. Electronically Signed   By: Gerome Samavid  Williams III M.D   On: 06/09/2018 13:40    EKG EKG Interpretation  Date/Time:  Thursday June 27 2018 07:50:30 EDT Ventricular Rate:  91 PR Interval:  162 QRS Duration: 86 QT Interval:  384 QTC Calculation: 472 R Axis:   -25 Text Interpretation:  Normal sinus rhythm with sinus arrhythmia Confirmed by Cathren LaineSteinl, Damaree Sargent ([id number]) on 06/27/2018 8:42:54 AM   Radiology Dg Chest 2 View  Result Date: 06/27/2018 CLINICAL DATA:  Cough and shortness of breath EXAM: CHEST - 2 VIEW COMPARISON:  June 09, 2018 in May 24, 2017 FINDINGS: There is patchy airspace consolidation in the inferior lingula and portions of the left lower lobe with small left pleural effusion. There is a minimal right pleural effusion. There are areas of atelectatic change in the right mid lung and right base regions. There is scarring in each apex region, stable. There is a degree of underlying interstitial thickening, potentially a degree of chronic interstitial edema. Heart is upper normal in size with pulmonary vascularity normal. No evident adenopathy. No bone lesions. There are foci of coronary artery  calcification. IMPRESSION: Small pleural effusions bilaterally, larger on the left than on the right. Consolidation involving portions of the inferior lingula and left lower lobe. Areas of atelectatic change on the right. Question chronic interstitial edema versus chronic inflammatory change leading to interstitial thickening. Heart remains upper normal in size. No evident adenopathy. There are foci of coronary artery calcification. Electronically Signed   By: Bretta Bang III M.D.   On: 06/27/2018 08:33   Ct Chest Wo Contrast  Result Date: 06/27/2018 CLINICAL DATA:  C/o cough x 1 week  HC afib, HTN EXAM: CT CHEST WITHOUT CONTRAST TECHNIQUE: Multidetector CT imaging of the chest was performed following the standard protocol without IV contrast. COMPARISON:  None. FINDINGS: Cardiovascular: Coronary artery calcification and aortic atherosclerotic calcification. Mediastinum/Nodes: No axillary supraclavicular adenopathy Enlarged prevascular lymph node measures up to 2 cm short axis (image 64/3. Esophagus normal Lungs/Pleura: Surgical staples LEFT RIGHT lung apex. Along the staple line LEFT upper lobe there is nodular consolidation measuring 2.5 by 1.8 cm (image 30/4. In the RIGHT upper lobe more central peribronchial consolidation with air bronchograms measures 1.6 by 1.5 cm (image 50/4. There are large bilateral pleural effusions. There is  atelectasis at the lung bases. Upper Abdomen: Limited view of the liver, kidneys, pancreas are unremarkable. Normal adrenal glands. Musculoskeletal: No aggressive osseous lesion IMPRESSION: 1. LEFT upper lobe and RIGHT upper lobe consolidative nodules with differential including bronchogenic carcinoma, pulmonary infection/inflammation, and pneumoconiosis. 2. Prior surgeries in the lung apices. 3. Large bilateral pleural effusions and mild interstitial edema in lower lobes are favored cardiogenic in nature 4. Consider bronchoscopy to evaluate the LEFT upper lobe lesion or outpatient FDG PET scan. Electronically Signed   By: Genevive Bi M.D.   On: 06/27/2018 11:41    Procedures Procedures (including critical care time)  Medications Ordered in ED Medications  0.9 %  sodium chloride infusion (has no administration in time range)     Initial Impression / Assessment and Plan / ED Course  I have reviewed the triage vital signs and the nursing notes.  Pertinent labs & imaging results that were available during my care of the patient were reviewed by me and considered in my medical decision making (see chart for details).  Iv ns. Labs. Cxr.  Reviewed nursing notes and prior charts for additional history.   cxr reviewed - effusion, possible infiltrate.   Labs reviewed - cri, c/w prior.  Will get ct.  CT scan reviewed - mod/large effusion, ?mass - given wt loss, suspicious mass, effusions, suspect ca.  Med service consulted for admission re dyspnea, effusions.     Final Clinical Impressions(s) / ED Diagnoses   Final diagnoses:  None    ED Discharge Orders    None       Cathren Laine, MD 06/27/18 1205

## 2018-06-27 NOTE — Consult Note (Signed)
Name: Joseph Nash MRN: [id number] DOB: [birth year]    ADMISSION DATE:  06/27/2018 CONSULTATION DATE:  06/27/18  REFERRING MD :  Dr. Lajuana RippleKamineni / [facility]   CHIEF COMPLAINT:  SOB, cough    HISTORY OF PRESENT ILLNESS:  62 y/o M, smoker (began in teens, smokes 1/2ppd typically, more if drinks more), who [facility] 7/18 with reports of cough and shortness of breath.    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  He also drove a fork lift for approximately 12 years.  He carries a medical history of HTN, CKD III, Anemia, Paroxysmal Atrial Fibrillation (not on anticoagulation due to fall, anemia), Hepatitis C (not treated, HCV RNA 6.083), SDH after fall in the setting of bradycardia (05/2017) & ETOH use (drinks one 40oz beer in two days).  He was recently admitted from 6/30-06/10/18 after abnormal labs with his PCP.  He was found to be anemic with a Hgb of 4.1g/dl.  He improved with PRBC's.  CT abdomen evaluated and negative for occult malignancy.  He was recommended for outpatient follow up by [facility].    He returned [facility] ER on 7/18 with reports of cough. He was seen by his PCP and told that he had "fluid around his heart" and instructed to come to the ER.  The patient reports black stools intermittently.  He has had shortness of breath with coughing episodes.  He reports minimal sputum production / clear to white.  He is uncertain if he has lost weight but thinks his clothes are fitting looser.  He has had swelling of his ankles, night sweats but no fevers / chills.  Denies incarceration, TB risk factors.  Denies hemoptysis. Denies prior lung surgeries.   Initial ER work up notable for Na 133, K 3.2, Sr Cr 1.98 (improved), AST 119, ALT 45, BNP >4,500, troponin 0.07, WBC 6, Hgb 11.8 and platelets 276.  CXR demonstrated bilateral pleural effusions L>R.  Follow up CT of the chest demonstrated LUL and RUL consolidative nodules, prior surgeries in the lung apices, large bilateral pleural  effusions, mild interstitial edema in lower lobes.    PCCM consulted for evaluation of abnormal CT findings.    PAST MEDICAL HISTORY :   has a past medical history of Anemia, Atrial fibrillation (HCC), Chronic hepatitis C (HCC), CKD (chronic kidney disease) stage 3, GFR 30-59 ml/min (HCC), Hypertension, Hyponatremia, and SDH (subdural hematoma) (HCC).   has a past surgical history that includes No past surgeries.  Prior to Admission medications   Medication Sig Start Date End Date Taking? Authorizing Provider  albuterol (PROVENTIL HFA;VENTOLIN HFA) 108 (90 Base) MCG/ACT inhaler Inhale 1 puff into the lungs every 6 (six) hours as needed for shortness of breath or wheezing. 06/07/18  Yes [provider]  benzonatate (TESSALON) 100 MG capsule Take 100 mg by mouth 3 (three) times daily as needed for cough.  06/24/18  Yes [provider]  FEROSUL 325 (65 Fe) MG tablet Take 1 tablet (325 mg total) by mouth 3 (three) times daily. Patient taking differently: Take 325 mg by mouth daily.  06/10/18  Yes Tyrone NineGrunz, Ryan B, MD  losartan-hydrochlorothiazide (HYZAAR) 50-12.5 MG tablet Take 1 tablet by mouth daily. 06/07/18  Yes [provider]  Magnesium Oxide 400 (240 Mg) MG TABS Take 400 mg by mouth daily.  04/01/18  Yes [provider]  metoprolol succinate (TOPROL-XL) 100 MG 24 hr tablet Take 100 mg by mouth daily. Take with or  immediately following a meal.   Yes [provider]  pantoprazole (PROTONIX) 20 MG tablet Take 1 tablet (20 mg total) by mouth daily. Patient not taking: Reported on 06/27/2018 06/10/18   Tyrone Nine, MD    No Known Allergies  FAMILY HISTORY:  family history includes Cancer in his mother; Diabetes in his brother; Heart failure in his father.  SOCIAL HISTORY:  reports that he has been smoking.  He has been smoking about 1.00 pack per day. He has never used smokeless tobacco. He reports that he drinks about 3.6 oz of alcohol per week. He  reports that he has current or past drug history. Drug: Cocaine.  REVIEW OF SYSTEMS:  POSITIVES IN BOLD Constitutional: Negative for fever, chills, weight loss, malaise/fatigue and diaphoresis.  HENT: Negative for hearing loss, ear pain, nosebleeds, congestion, sore throat, neck pain, tinnitus and ear discharge.   Eyes: Negative for blurred vision, double vision, photophobia, pain, discharge and redness.  Respiratory: Negative for cough, hemoptysis, sputum production, shortness of breath, wheezing and stridor.   Cardiovascular: Negative for chest pain, palpitations, orthopnea, claudication, leg swelling and PND.  Gastrointestinal: Negative for heartburn, nausea, vomiting, abdominal pain, diarrhea, constipation, blood in stool and melena.  Genitourinary: Negative for dysuria, urgency, frequency, hematuria and flank pain.  Musculoskeletal: Negative for myalgias, back pain, joint pain and falls.  Skin: Negative for itching and rash.  Neurological: Negative for dizziness, tingling, tremors, sensory change, speech change, focal weakness, seizures, loss of consciousness, weakness and headaches.  Endo/Heme/Allergies: Negative for environmental allergies and polydipsia. Does not bruise/bleed easily.   SUBJECTIVE:   VITAL SIGNS: Temp:  [97.8 F (36.6 C)] 97.8 F (36.6 C) (07/18 0752) Pulse Rate:  [85-88] 85 (07/18 1330) BP: (190-202)/(102-118) 193/102 (07/18 1330) SpO2:  [90 %-100 %] 100 % (07/18 1330)  PHYSICAL EXAMINATION: General: thin adult male in NAD lying on ER stretcher  Neuro:  AAOx4, speech clear, MAE HEENT:  Mild temporal wasting, JVD+, mild icterus Cardiovascular:  s1s2 rrr, no m/r/g Lungs:  Even/non-labored, lungs bilaterally clear, no chest scars, hyperpigmentation of upper back skin  Abdomen:  Flat/soft, bsx4 active  Musculoskeletal:  No acute deformities  Skin:  Warm/dry, 1-2+ ankle edema  Recent Labs  Lab 06/27/18 0901  NA 133*  K 3.2*  CL 102  CO2 22  BUN 22    CREATININE 1.98*  GLUCOSE 76    Recent Labs  Lab 06/27/18 0901  HGB 11.8*  HCT 38.8*  WBC 6.0  PLT 276    Dg Chest 2 View  Result Date: 06/27/2018 CLINICAL DATA:  Cough and shortness of breath EXAM: CHEST - 2 VIEW COMPARISON:  June 09, 2018 in May 24, 2017 FINDINGS: There is patchy airspace consolidation in the inferior lingula and portions of the left lower lobe with small left pleural effusion. There is a minimal right pleural effusion. There are areas of atelectatic change in the right mid lung and right base regions. There is scarring in each apex region, stable. There is a degree of underlying interstitial thickening, potentially a degree of chronic interstitial edema. Heart is upper normal in size with pulmonary vascularity normal. No evident adenopathy. No bone lesions. There are foci of coronary artery calcification. IMPRESSION: Small pleural effusions bilaterally, larger on the left than on the right. Consolidation involving portions of the inferior lingula and left lower lobe. Areas of atelectatic change on the right. Question chronic interstitial edema versus chronic inflammatory change leading to interstitial thickening. Heart remains upper normal in size.  No evident adenopathy. There are foci of coronary artery calcification. Electronically Signed   By: Bretta Bang III M.D.   On: 06/27/2018 08:33   Ct Chest Wo Contrast  Result Date: 06/27/2018 CLINICAL DATA:  C/o cough x 1 week  HC afib, HTN EXAM: CT CHEST WITHOUT CONTRAST TECHNIQUE: Multidetector CT imaging of the chest was performed following the standard protocol without IV contrast. COMPARISON:  None. FINDINGS: Cardiovascular: Coronary artery calcification and aortic atherosclerotic calcification. Mediastinum/Nodes: No axillary supraclavicular adenopathy Enlarged prevascular lymph node measures up to 2 cm short axis (image 64/3. Esophagus normal Lungs/Pleura: Surgical staples LEFT RIGHT lung apex. Along the staple line  LEFT upper lobe there is nodular consolidation measuring 2.5 by 1.8 cm (image 30/4. In the RIGHT upper lobe more central peribronchial consolidation with air bronchograms measures 1.6 by 1.5 cm (image 50/4. There are large bilateral pleural effusions. There is atelectasis at the lung bases. Upper Abdomen: Limited view of the liver, kidneys, pancreas are unremarkable. Normal adrenal glands. Musculoskeletal: No aggressive osseous lesion IMPRESSION: 1. LEFT upper lobe and RIGHT upper lobe consolidative nodules with differential including bronchogenic carcinoma, pulmonary infection/inflammation, and pneumoconiosis. 2. Prior surgeries in the lung apices. 3. Large bilateral pleural effusions and mild interstitial edema in lower lobes are favored cardiogenic in nature 4. Consider bronchoscopy to evaluate the LEFT upper lobe lesion or outpatient FDG PET scan. Electronically Signed   By: Genevive Bi M.D.   On: 06/27/2018 11:41      SIGNIFICANT EVENTS  7/18 Admit with cough, SOB x3 weeks   STUDIES CT ABD/Pelvis 6/30 >> bilateral moderate to large pleural effusions with dependent atelectasis, no acute abnormalities within the abdomen or pelvis, cholelithiasis, non-obstructive left nephrolithiasis CT Chest 7/18 >> LUL and RUL consolidative nodules, large bilateral pleural effusions, mild interstitial edema in lower lobes  CULTURES Tracheal Aspirate 7/18 >>  AFB 7/18 >>  AFB 7/19 >>  ANTIBIOTICS  Augmentin 7/18 >>   ASSESSMENT / PLAN:  Discussion:  62 y/o M admitted with cough, SOB.  Found to have bilateral pleural effusions and bilateral upper lobe nodular consolidation.  He has had weight loss, night sweats, recent anemia.  CT review appears to be scarring in his upper lobes but must also consider bacterial / mycobacterial infection given location.   Bilateral Upper Lobe Nodular Infiltrates - ? Scarring vs active infection  Tobacco Abuse  Weight Loss / Night Sweats - pts weight documented at 132  in 05/2017  Bilateral Pleural Effusions   Plan: Agree with diuresis, 20 mg IV QD Monitor renal function with diuresis  If effusions do not improve with diuresis, may need thoracentesis  Airborne precautions Send sputum for AFB, tracheal aspirate Empiric Augmentin for possible infection  Will likely need outpatient PET CT  Follow intermittent CXR   Canary Brim, NP-C [facility] Pgr: [phone] or if no answer [phone] 06/27/2018, 1:50 PM

## 2018-06-27 NOTE — ED Notes (Signed)
History of hypertension, has not taken medications today.

## 2018-06-27 NOTE — H&P (Addendum)
History and Physical    DOA: 06/27/2018  PCP: Grayce Sessions, NP  Patient coming from: Home  Chief Complaint: Dyspnea with cough for 3 weeks  HPI: Joseph Nash is a 62 y.o. male with past medical history h/o uncontrolled HTN, Hep C, tobacco no alcohol use, stage III CKD, PAF no longer on anticoagulation after fall with SDH 2018 who was recently admitted to this medical center for anemia work-up (presented on June 30 with hemoglobin of 4.1) as well as shortness of breath associated with weight loss.  Patient received 3 units of PRBC as well as IV iron.  His fecal occult blood test was negative and CT abdomen was negative for malignancy.  He was discharged with recommendations to avoid NSAIDs/aspirin and to follow-up outpatient GI for possible colonoscopy.  Hemoglobin at the last discharge improved to 9.9. Patient presented today to ED with worsening dyspnea. Patient states he had a dry cough during the last admission and after discharge it progressively got worse.  He now has a wet cough but hardly able to produce phlegm.  Denies any hemoptysis.  He did lose about 30 pounds of weight in the last 3 to 4 months.  Patient states he saw his PCP for follow-up last week and yesterday he received a call from her advising him to go to the ER for "fluid around my heart".  He denies any hematemesis, melena, hematochezia, abdominal pain, chest pain or headaches.  Work-up in the emergency room including CT chest revealed bilateral large pleural effusions as well as bilateral upper lobe nodules raising suspicion for bronchogenic carcinoma.  Labs show a hemoglobin of 11.8, sodium 133, potassium 3.2 and abnormal LFTs.  He is requested to be admitted for further evaluation and management.   Review of Systems: As per HPI otherwise 10 point review of systems negative.    Past Medical History:  Diagnosis Date  . Anemia   . Atrial fibrillation (HCC)   . Chronic hepatitis C (HCC)   . CKD (chronic kidney  disease) stage 3, GFR 30-59 ml/min (HCC)   . Hypertension   . Hyponatremia   . SDH (subdural hematoma) (HCC)     Past Surgical History:  Procedure Laterality Date  . NO PAST SURGERIES     Social h/o Patient reports history of smoking 5 cigarettes a day for the last 30 years.  Drinks 40 ounce beer couple of times a week.  Denies drug abuse.  Lives alone.  His niece lives close by about 10 minutes from his house and checks on him frequently.   No Known Allergies  Family History  Problem Relation Age of Onset  . Cancer Mother   . Heart failure Father   . Diabetes Brother       Prior to Admission medications   Medication Sig Start Date End Date Taking? Authorizing Provider  albuterol (PROVENTIL HFA;VENTOLIN HFA) 108 (90 Base) MCG/ACT inhaler Inhale 1 puff into the lungs every 6 (six) hours as needed for shortness of breath or wheezing. 06/07/18   [provider]  FEROSUL 325 (65 Fe) MG tablet Take 1 tablet (325 mg total) by mouth 3 (three) times daily. 06/10/18   Tyrone Nine, MD  losartan-hydrochlorothiazide (HYZAAR) 50-12.5 MG tablet Take 1 tablet by mouth daily. 06/07/18   [provider]  Magnesium Oxide 400 (240 Mg) MG TABS Take 1 tablet by mouth daily. 04/01/18   [provider]  Melatonin 5 MG CHEW Chew 5 mg by mouth daily as  needed (for sleep).    [provider]  metoprolol succinate (TOPROL-XL) 100 MG 24 hr tablet Take 100 mg by mouth daily. Take with or immediately following a meal.    [provider]  pantoprazole (PROTONIX) 20 MG tablet Take 1 tablet (20 mg total) by mouth daily. 06/10/18   Tyrone Nine, MD    Physical Exam: Vitals:   06/27/18 0752 06/27/18 0900  BP: (!) 202/118 (!) 190/104  Pulse: 88 88  Temp: 97.8 F (36.6 C)   TempSrc: Oral   SpO2: 90% 96%    Constitutional: NAD, calm, comfortable Vitals:   06/27/18 0752 06/27/18 0900  BP: (!) 202/118 (!) 190/104  Pulse: 88 88  Temp: 97.8 F (36.6 C)   TempSrc:  Oral   SpO2: 90% 96%   Eyes: PERRL, lids and conjunctivae normal ENMT: Mucous membranes are moist. Posterior pharynx clear of any exudate or lesions.Normal dentition.  Bitemporal wasting Neck: normal, supple, no masses, no thyromegaly Respiratory: Decreased breath sounds at bases, clear to auscultation bilaterally, no wheezing, no crackles. Normal respiratory effort. No accessory muscle use.  Cardiovascular: Regular rate and rhythm, no murmurs / rubs / gallops.  Trace leg and mild ankle edema. 2+ pedal pulses. No carotid bruits.  Abdomen: no tenderness, no masses palpated. No hepatosplenomegaly. Bowel sounds positive.  Musculoskeletal: no clubbing / cyanosis. No joint deformity upper and lower extremities. Good ROM, no contractures. Normal muscle tone.  Neurologic: CN 2-12 grossly intact. Sensation intact, DTR normal. Strength 5/5 in all 4.  Psychiatric: Normal judgment and insight. Alert and oriented x 3. Normal mood.  SKIN/catheters: no rashes, lesions, ulcers. No induration  Labs on Admission: I have personally reviewed following labs and imaging studies  CBC: Recent Labs  Lab 06/27/18 0901  WBC 6.0  HGB 11.8*  HCT 38.8*  MCV 86.0  PLT 276   Basic Metabolic Panel: Recent Labs  Lab 06/27/18 0901  NA 133*  K 3.2*  CL 102  CO2 22  GLUCOSE 76  BUN 22  CREATININE 1.98*  CALCIUM 8.2*   GFR: CrCl cannot be calculated (Unknown ideal weight.). Liver Function Tests: Recent Labs  Lab 06/27/18 0901  AST 119*  ALT 45*  ALKPHOS 82  BILITOT 0.6  PROT 8.1  ALBUMIN 2.3*   No results for input(s): LIPASE, AMYLASE in the last 168 hours. No results for input(s): AMMONIA in the last 168 hours. Coagulation Profile: No results for input(s): INR, PROTIME in the last 168 hours. Cardiac Enzymes: No results for input(s): CKTOTAL, CKMB, CKMBINDEX, TROPONINI in the last 168 hours. BNP (last 3 results) No results for input(s): PROBNP in the last 8760 hours. HbA1C: No results for  input(s): HGBA1C in the last 72 hours. CBG: No results for input(s): GLUCAP in the last 168 hours. Lipid Profile: No results for input(s): CHOL, HDL, LDLCALC, TRIG, CHOLHDL, LDLDIRECT in the last 72 hours. Thyroid Function Tests: No results for input(s): TSH, T4TOTAL, FREET4, T3FREE, THYROIDAB in the last 72 hours. Anemia Panel: No results for input(s): VITAMINB12, FOLATE, FERRITIN, TIBC, IRON, RETICCTPCT in the last 72 hours. Urine analysis:    Component Value Date/Time   COLORURINE YELLOW 05/24/2017 0821   APPEARANCEUR CLEAR 05/24/2017 0821   LABSPEC 1.009 05/24/2017 0821   PHURINE 5.0 05/24/2017 0821   GLUCOSEU NEGATIVE 05/24/2017 0821   HGBUR SMALL (A) 05/24/2017 0821   BILIRUBINUR NEGATIVE 05/24/2017 0821   KETONESUR NEGATIVE 05/24/2017 0821   PROTEINUR 100 (A) 05/24/2017 0821   NITRITE NEGATIVE 05/24/2017 1610  LEUKOCYTESUR NEGATIVE 05/24/2017 [id number]    Radiological Exams on Admission: Dg Chest 2 View  Result Date: 06/27/2018 CLINICAL DATA:  Cough and shortness of breath EXAM: CHEST - 2 VIEW COMPARISON:  June 09, 2018 in May 24, 2017 FINDINGS: There is patchy airspace consolidation in the inferior lingula and portions of the left lower lobe with small left pleural effusion. There is a minimal right pleural effusion. There are areas of atelectatic change in the right mid lung and right base regions. There is scarring in each apex region, stable. There is a degree of underlying interstitial thickening, potentially a degree of chronic interstitial edema. Heart is upper normal in size with pulmonary vascularity normal. No evident adenopathy. No bone lesions. There are foci of coronary artery calcification. IMPRESSION: Small pleural effusions bilaterally, larger on the left than on the right. Consolidation involving portions of the inferior lingula and left lower lobe. Areas of atelectatic change on the right. Question chronic interstitial edema versus chronic inflammatory change leading  to interstitial thickening. Heart remains upper normal in size. No evident adenopathy. There are foci of coronary artery calcification. Electronically Signed   By: Bretta BangWilliam  Woodruff III M.D.   On: 06/27/2018 08:33   Ct Chest Wo Contrast  Result Date: 06/27/2018 CLINICAL DATA:  C/o cough x 1 week  HC afib, HTN EXAM: CT CHEST WITHOUT CONTRAST TECHNIQUE: Multidetector CT imaging of the chest was performed following the standard protocol without IV contrast. COMPARISON:  None. FINDINGS: Cardiovascular: Coronary artery calcification and aortic atherosclerotic calcification. Mediastinum/Nodes: No axillary supraclavicular adenopathy Enlarged prevascular lymph node measures up to 2 cm short axis (image 64/3. Esophagus normal Lungs/Pleura: Surgical staples LEFT RIGHT lung apex. Along the staple line LEFT upper lobe there is nodular consolidation measuring 2.5 by 1.8 cm (image 30/4. In the RIGHT upper lobe more central peribronchial consolidation with air bronchograms measures 1.6 by 1.5 cm (image 50/4. There are large bilateral pleural effusions. There is atelectasis at the lung bases. Upper Abdomen: Limited view of the liver, kidneys, pancreas are unremarkable. Normal adrenal glands. Musculoskeletal: No aggressive osseous lesion IMPRESSION: 1. LEFT upper lobe and RIGHT upper lobe consolidative nodules with differential including bronchogenic carcinoma, pulmonary infection/inflammation, and pneumoconiosis. 2. Prior surgeries in the lung apices. 3. Large bilateral pleural effusions and mild interstitial edema in lower lobes are favored cardiogenic in nature 4. Consider bronchoscopy to evaluate the LEFT upper lobe lesion or outpatient FDG PET scan. Electronically Signed   By: Genevive BiStewart  Edmunds M.D.   On: 06/27/2018 11:41    EKG: Independently reviewed.  Normal sinus rhythm with no acute ST-T changes.  Sinus arrhythmia noted  Assessment Active Problems:   Hyponatremia   Chronic viral hepatitis C (HCC)   Severe  anemia   CKD (chronic kidney disease)     Plan:   1.  Bilateral pleural effusion, lung nodules, weight loss: Highly suspicious for lung carcinoma with malignant effusions.  He is a smoker and reports significant weight loss.  Will consult pulmonary for bronchoscopy and further recommendations on pleural fluid tap/analysis.  If pleural fluid unremarkable, may need bronchoscopy.  Patient saturating well on room air.  No emergent need for tap.  Given bilateral nature of pleural fluid, will obtain echo.  BNP greater than 4500 in the setting of chronic kidney disease.  Patient does not have fever or leukocytosis to suggest infection.  Will hold off on antibiotics.  2.  Chronic kidney disease stage III: Will avoid aggressive diuresis.  Patient on losartan/HCTZ at baseline. Will  hold HCTZ and order Lasix .  Thoracentesis is probably a better solution for him as he does not have significant leg edema or pulmonary edema.   3.  Paroxysmal atrial fibrillation: Off anticoagulation given history of SDH  4.  Severe iron deficiency anemia: Status post 3 units PRBC transfusion in the last admission.  He also received IV iron previously, hemoglobin improved and stable. Currently, no signs of active bleeding.  Will need colonoscopy at some point . Will order CEA/CA-125  5.  Uncontrolled hypertension: Patient states his systolic blood pressure usually is high around 160-170.  His home medication list indicates Hyzaar and beta-blockers, add hydralazine or Norvasc to his regimen.   6.  Hypokalemia: Replace  7.  Hyponatremia: Stable around 131-133.  Could be tumor related SIADH.  8.  Tobacco/alcohol abuse: Counseled regarding risks and encouraged to quit  DVT prophylaxis: SCD given severe anemia and recent admission and concern for occult bleeding  Code Status: full code as confirmed with patient and niece, Suzette Battiest (healthcare proxy) who can be reached at [phone].    Consults called: Pulmonary Admission  status: Patient admitted as inpatient as anticipated LOS greater than 2 midnights    Alessandra Bevels MD [facility] Pager [phone]  If 7PM-7AM, please contact night-coverage [email or web address] Password [facility]  06/27/2018, 12:53 PM

## 2018-06-28 ENCOUNTER — Inpatient Hospital Stay (HOSPITAL_COMMUNITY): Payer: BLUE CROSS/BLUE SHIELD

## 2018-06-28 DIAGNOSIS — I361 Nonrheumatic tricuspid (valve) insufficiency: Secondary | ICD-10-CM

## 2018-06-28 DIAGNOSIS — R0600 Dyspnea, unspecified: Secondary | ICD-10-CM

## 2018-06-28 LAB — BODY FLUID CELL COUNT WITH DIFFERENTIAL
Eos, Fluid: 0 %
LYMPHS FL: 87 %
Monocyte-Macrophage-Serous Fluid: 5 % — ABNORMAL LOW (ref 50–90)
Neutrophil Count, Fluid: 8 % (ref 0–25)
OTHER CELLS FL: 0 %
Total Nucleated Cell Count, Fluid: 14 cu mm (ref 0–1000)

## 2018-06-28 LAB — COMPREHENSIVE METABOLIC PANEL
ALBUMIN: 2.1 g/dL — AB (ref 3.5–5.0)
ALT: 35 U/L (ref 0–44)
ANION GAP: 8 (ref 5–15)
AST: 77 U/L — ABNORMAL HIGH (ref 15–41)
Alkaline Phosphatase: 74 U/L (ref 38–126)
BILIRUBIN TOTAL: 0.6 mg/dL (ref 0.3–1.2)
BUN: 24 mg/dL — AB (ref 8–23)
CALCIUM: 8.2 mg/dL — AB (ref 8.9–10.3)
CO2: 26 mmol/L (ref 22–32)
Chloride: 102 mmol/L (ref 98–111)
Creatinine, Ser: 1.99 mg/dL — ABNORMAL HIGH (ref 0.61–1.24)
GFR calc non Af Amer: 34 mL/min — ABNORMAL LOW (ref >60)
GFR, EST AFRICAN AMERICAN: 39 mL/min — AB (ref >60)
Glucose, Bld: 94 mg/dL (ref 70–99)
Potassium: 3.8 mmol/L (ref 3.5–5.1)
SODIUM: 136 mmol/L (ref 135–145)
TOTAL PROTEIN: 7.3 g/dL (ref 6.5–8.1)

## 2018-06-28 LAB — CA 125: Cancer Antigen (CA) 125: 50 U/mL

## 2018-06-28 LAB — PROTEIN, TOTAL: Total Protein: 7.3 g/dL (ref 6.5–8.1)

## 2018-06-28 LAB — ECHOCARDIOGRAM COMPLETE
Height: 69 in
Weight: 1918.8838 oz

## 2018-06-28 LAB — PROTEIN, PLEURAL OR PERITONEAL FLUID

## 2018-06-28 LAB — LACTATE DEHYDROGENASE, PLEURAL OR PERITONEAL FLUID: LD, Fluid: 67 U/L — ABNORMAL HIGH (ref 3–23)

## 2018-06-28 LAB — CEA: CEA: 5.7 ng/mL — ABNORMAL HIGH (ref 0.0–4.7)

## 2018-06-28 LAB — LACTATE DEHYDROGENASE: LDH: 300 U/L — ABNORMAL HIGH (ref 98–192)

## 2018-06-28 MED ORDER — PREDNISONE 20 MG PO TABS
40.0000 mg | ORAL_TABLET | Freq: Every day | ORAL | Status: DC
  Administered 2018-06-28 – 2018-07-02 (×5): 40 mg via ORAL
  Filled 2018-06-28 (×6): qty 2

## 2018-06-28 MED ORDER — ADULT MULTIVITAMIN W/MINERALS CH
1.0000 | ORAL_TABLET | Freq: Every day | ORAL | Status: DC
  Administered 2018-06-28 – 2018-07-03 (×6): 1 via ORAL
  Filled 2018-06-28 (×6): qty 1

## 2018-06-28 MED ORDER — AMLODIPINE BESYLATE 5 MG PO TABS
10.0000 mg | ORAL_TABLET | Freq: Every day | ORAL | Status: DC
  Administered 2018-06-28 – 2018-07-03 (×6): 10 mg via ORAL
  Filled 2018-06-28 (×3): qty 2
  Filled 2018-06-28: qty 4
  Filled 2018-06-28 (×2): qty 2

## 2018-06-28 MED ORDER — AZITHROMYCIN 250 MG PO TABS
500.0000 mg | ORAL_TABLET | Freq: Every day | ORAL | Status: DC
  Administered 2018-06-28 – 2018-07-03 (×6): 500 mg via ORAL
  Filled 2018-06-28 (×6): qty 2

## 2018-06-28 MED ORDER — IPRATROPIUM-ALBUTEROL 0.5-2.5 (3) MG/3ML IN SOLN
3.0000 mL | Freq: Four times a day (QID) | RESPIRATORY_TRACT | Status: DC
  Administered 2018-06-28: 3 mL via RESPIRATORY_TRACT
  Filled 2018-06-28 (×2): qty 3

## 2018-06-28 MED ORDER — IPRATROPIUM-ALBUTEROL 0.5-2.5 (3) MG/3ML IN SOLN: 3.0000 mL | RESPIRATORY_TRACT | Status: DC | PRN

## 2018-06-28 MED ORDER — FUROSEMIDE 10 MG/ML IJ SOLN
40.0000 mg | Freq: Once | INTRAMUSCULAR | Status: AC
  Administered 2018-06-28: 40 mg via INTRAVENOUS
  Filled 2018-06-28: qty 4

## 2018-06-28 MED ORDER — ENSURE ENLIVE PO LIQD
237.0000 mL | Freq: Three times a day (TID) | ORAL | Status: DC
  Administered 2018-06-28 – 2018-07-03 (×11): 237 mL via ORAL

## 2018-06-28 MED ORDER — SODIUM CHLORIDE 0.9 % IV SOLN
1.0000 g | INTRAVENOUS | Status: DC
  Administered 2018-06-28 – 2018-07-02 (×5): 1 g via INTRAVENOUS
  Filled 2018-06-28 (×6): qty 10

## 2018-06-28 MED ORDER — IPRATROPIUM-ALBUTEROL 0.5-2.5 (3) MG/3ML IN SOLN
3.0000 mL | Freq: Three times a day (TID) | RESPIRATORY_TRACT | Status: DC
  Administered 2018-06-29 – 2018-07-03 (×13): 3 mL via RESPIRATORY_TRACT
  Filled 2018-06-28 (×13): qty 3

## 2018-06-28 NOTE — Progress Notes (Signed)
Initial Nutrition Assessment  DOCUMENTATION CODES:   Underweight, Severe malnutrition in context of chronic illness  INTERVENTION:   -Ensure Enlive po TID, each supplement provides 350 kcal and 20 grams of protein -Magic Cup TID with meals, each supplement provides 290 kcals and 9 grams protein -MVI with minerals daily  NUTRITION DIAGNOSIS:   Severe Malnutrition related to chronic illness(chronic hepatitis/alcohol abuse) as evidenced by energy intake < or equal to 75% for > or equal to 1 month, severe fat depletion, severe muscle depletion.  GOAL:   Patient will meet greater than or equal to 90% of their needs  MONITOR:   PO intake, Supplement acceptance, Weight trends, Labs  REASON FOR ASSESSMENT:   Malnutrition Screening Tool    ASSESSMENT:   Patient with PMH significant for uncontrolled HTN, alcohol abuse, hepatitis C, CKD III, PAF, and SDH 2018 after a fall. Recently admitted 6/30 for anemia. Presents this admission with complaints of dry cough. Admitted for bilateral pleural effusions, lung nodule, and wt loss.    Pt admits his appetite increased slightly after being discharged at the beginning of the month. States he would attempt to eat two meals/day that consisted of restaurant foods, soup, and sandwiches. States he did not continue his Ensure at home, but would like them this stay. RD observed breakfast tray with french toast 75% completed. Reiterated the importance of good meal intake to promote healing.   Per last visit pt experienced a wt loss of 14.3% over the past year which is not significant for time frame. During pt's last admission he weighed 113 lb and this admission weighs 119 lb. Do not suspect any further weight loss. Pt remains severely malnourished. Nutrition-Focused physical exam completed.   Medications reviewed and include: Mg oxide, prednisone Labs reviewed.   NUTRITION - FOCUSED PHYSICAL EXAM:    Most Recent Value  Orbital Region  Severe  depletion  Upper Arm Region  Severe depletion  Thoracic and Lumbar Region  Unable to assess  Buccal Region  Moderate depletion  Temple Region  Severe depletion  Clavicle Bone Region  Moderate depletion  Clavicle and Acromion Bone Region  Mild depletion  Scapular Bone Region  Unable to assess  Dorsal Hand  Moderate depletion  Patellar Region  Severe depletion  Anterior Thigh Region  Severe depletion  Posterior Calf Region  Severe depletion  Edema (RD Assessment)  None  Hair  Reviewed  Eyes  Reviewed  Mouth  Reviewed  Skin  Reviewed  Nails  Reviewed     Diet Order:   Diet Order           Diet 2 gram sodium Room service appropriate? Yes; Fluid consistency: Thin  Diet effective now          EDUCATION NEEDS:      Skin:  Skin Assessment: Reviewed RN Assessment  Last BM:  06/26/18  Height:   Ht Readings from Last 1 Encounters:  06/27/18 5\' 9"  (1.753 m)    Weight:   Wt Readings from Last 1 Encounters:  06/27/18 119 lb 14.9 oz (54.4 kg)    Ideal Body Weight:  72.7 kg  BMI:  Body mass index is 17.71 kg/m.  Estimated Nutritional Needs:   Kcal:  1800-2000  Protein:  100-115 grams   Fluid:  >/= 1.8 L/day    Vanessa Kickarly Limuel Nieblas RD, LDN Clinical Nutrition Pager # - [phone]

## 2018-06-28 NOTE — Progress Notes (Signed)
Name: Joseph Nash MRN: [id number] DOB: 1956-01-18    ADMISSION DATE:  06/27/2018 CONSULTATION DATE:  06/27/18  REFERRING MD :  Dr. Lajuana RippleKamineni / [facility]   CHIEF COMPLAINT:  SOB, cough    HISTORY OF PRESENT ILLNESS:  62 y/o M, smoker (began in teens, smokes 1/2ppd typically, more if drinks more), who [facility]/18 with reports of cough and shortness of breath.    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  He also drove a fork lift for approximately 12 years.  He carries a medical history of HTN, CKD III, Anemia, Paroxysmal Atrial Fibrillation (not on anticoagulation due to fall, anemia), Hepatitis C (not treated, HCV RNA 6.083), SDH after fall in the setting of bradycardia (05/2017) & ETOH use (drinks one 40oz beer in two days).  He was recently admitted from 6/30-06/10/18 after abnormal labs with his PCP.  He was found to be anemic with a Hgb of 4.1g/dl.  He improved with PRBC's.  CT abdomen evaluated and negative for occult malignancy.  He was recommended for outpatient follow up by [facility].    [facility] 7/18 with reports of cough. He was seen by his PCP and told that he had "fluid around his heart" and instructed to come to the ER.  The patient reports black stools intermittently.  He has had shortness of breath with coughing episodes.  He reports minimal sputum production / clear to white.  He is uncertain if he has lost weight but thinks his clothes are fitting looser.  He has had swelling of his ankles, night sweats but no fevers / chills.  Denies incarceration, TB risk factors.  Denies hemoptysis. Denies prior lung surgeries.   Initial ER work up notable for Na 133, K 3.2, Sr Cr 1.98 (improved), AST 119, ALT 45, BNP >4,500, troponin 0.07, WBC 6, Hgb 11.8 and platelets 276.  CXR demonstrated bilateral pleural effusions L>R.  Follow up CT of the chest demonstrated LUL and RUL consolidative nodules, prior surgeries in the lung apices, large bilateral pleural  effusions, mild interstitial edema in lower lobes.    PCCM consulted for evaluation of abnormal CT findings.    SUBJECTIVE: Pt reports he feels ok.  Not much appetite.  Ongoing cough.   VITAL SIGNS: Temp:  [98 F (36.7 C)-99.2 F (37.3 C)] 98 F (36.7 C) (07/19 1317) Pulse Rate:  [66-108] 66 (07/19 1317) Resp:  [18-21] 20 (07/19 1317) BP: (149-203)/(77-105) 168/84 (07/19 1317) SpO2:  [90 %-100 %] 90 % (07/19 1317) Weight:  [119 lb 14.9 oz (54.4 kg)] 119 lb 14.9 oz (54.4 kg) (07/18 1519)  PHYSICAL EXAMINATION: General: thin adult male in NAD, sitting at side of bed HEENT: MM pink/moist, good dentition  Neuro: AAOx4, speech clear, MAE  CV: s1s2 rrr, no m/r/g PULM: even/non-labored, lungs bilaterally diminished posterior lower, dullness posterior lower NW:GNFAGI:soft, non-tender, bsx4 active  Extremities: warm/dry, trace ankle edema  Skin: no rashes or lesions  Recent Labs  Lab 06/27/18 0901 06/28/18 0353  NA 133* 136  K 3.2* 3.8  CL 102 102  CO2 22 26  BUN 22 24*  CREATININE 1.98* 1.99*  GLUCOSE 76 94    Recent Labs  Lab 06/27/18 0901  HGB 11.8*  HCT 38.8*  WBC 6.0  PLT 276    Dg Chest 2 View  Result Date: 06/27/2018 CLINICAL DATA:  Cough and shortness of breath EXAM: CHEST - 2 VIEW COMPARISON:  June 09, 2018 in May 24, 2017 FINDINGS: There is patchy airspace consolidation in the inferior lingula and portions of the left lower lobe with small left pleural effusion. There is a minimal right pleural effusion. There are areas of atelectatic change in the right mid lung and right base regions. There is scarring in each apex region, stable. There is a degree of underlying interstitial thickening, potentially a degree of chronic interstitial edema. Heart is upper normal in size with pulmonary vascularity normal. No evident adenopathy. No bone lesions. There are foci of coronary artery calcification. IMPRESSION: Small pleural effusions bilaterally, larger on the left than on the  right. Consolidation involving portions of the inferior lingula and left lower lobe. Areas of atelectatic change on the right. Question chronic interstitial edema versus chronic inflammatory change leading to interstitial thickening. Heart remains upper normal in size. No evident adenopathy. There are foci of coronary artery calcification. Electronically Signed   By: Bretta Bang III M.D.   On: 06/27/2018 08:33   Ct Chest Wo Contrast  Addendum Date: 06/27/2018   ADDENDUM REPORT: 06/27/2018 15:38 ADDENDUM: Further clinical data provided. Upon PA interviewing patient, no reported pulmonary surgeries; therefore the linear densities at the lung apices are felt to represent inflammatory calcifications. This would increase suspicion of pneumoconiosis/inflammatory process. Would also consider mycobacterium infection. Consider thoracentesis with cytology/microbiology if concern for malignancy. Findings conveyed to Earnest Rosier, pulmonology PAon 06/27/2018 at15:37. Electronically Signed   By: Genevive Bi M.D.   On: 06/27/2018 15:38   Result Date: 06/27/2018 CLINICAL DATA:  C/o cough x 1 week  HC afib, HTN EXAM: CT CHEST WITHOUT CONTRAST TECHNIQUE: Multidetector CT imaging of the chest was performed following the standard protocol without IV contrast. COMPARISON:  None. FINDINGS: Cardiovascular: Coronary artery calcification and aortic atherosclerotic calcification. Mediastinum/Nodes: No axillary supraclavicular adenopathy Enlarged prevascular lymph node measures up to 2 cm short axis (image 64/3. Esophagus normal Lungs/Pleura: Surgical staples LEFT RIGHT lung apex. Along the staple line LEFT upper lobe there is nodular consolidation measuring 2.5 by 1.8 cm (image 30/4. In the RIGHT upper lobe more central peribronchial consolidation with air bronchograms measures 1.6 by 1.5 cm (image 50/4. There are large bilateral pleural effusions. There is atelectasis at the lung bases. Upper Abdomen: Limited view of the  liver, kidneys, pancreas are unremarkable. Normal adrenal glands. Musculoskeletal: No aggressive osseous lesion IMPRESSION: 1. LEFT upper lobe and RIGHT upper lobe consolidative nodules with differential including bronchogenic carcinoma, pulmonary infection/inflammation, and pneumoconiosis. 2. Prior surgeries in the lung apices. 3. Large bilateral pleural effusions and mild interstitial edema in lower lobes are favored cardiogenic in nature 4. Consider bronchoscopy to evaluate the LEFT upper lobe lesion or outpatient FDG PET scan. Electronically Signed: By: Genevive Bi M.D. On: 06/27/2018 11:41      SIGNIFICANT EVENTS  7/18 Admit with cough, SOB x3 weeks   STUDIES CT ABD/Pelvis 6/30 >> bilateral moderate to large pleural effusions with dependent atelectasis, no acute abnormalities within the abdomen or pelvis, cholelithiasis, non-obstructive left nephrolithiasis CT Chest 7/18 >> LUL and RUL consolidative nodules, large bilateral pleural effusions, mild interstitial edema in lower lobes  CULTURES Tracheal Aspirate 7/18 >>  AFB 7/18 >>  AFB 7/19 >> Pleural 7/19 >>   ANTIBIOTICS  Augmentin 7/18 >>   ASSESSMENT / PLAN:  Discussion:  62 y/o M admitted with cough, SOB.  Found to have bilateral pleural effusions and bilateral upper lobe nodular consolidation.  He has had weight loss, night sweats, recent anemia.  CT review appears  to be scarring in his upper lobes but must also consider bacterial / mycobacterial infection given location.   Bilateral Upper Lobe Nodular Infiltrates - ? Scarring vs active infection, malignancy Tobacco Abuse  Weight Loss / Night Sweats - pts weight documented at 132 in 05/2017  Bilateral Pleural Effusions   Plan: Now thoracentesis for pleural fluid evaluation  Diuresis per primary Continue airborne precautions Follow sputum for AFB, tracheal aspirate  Empiric Augmentin  Will need outpatient PET scan  Follow intermittent CXR   PCCM will follow up  again 7/22  Canary Brim, NP-C [facility] Pgr: [phone] or if no answer [phone] 06/28/2018, 2:18 PM

## 2018-06-28 NOTE — Progress Notes (Addendum)
PROGRESS NOTE    Joseph Nash  NFA:[id number] DOB: [birth year] DOA: 06/27/2018 PCP: Grayce Sessions, NP    Brief Narrative:  62 year old male who presented with dyspnea and cough.  Patient does have the significant past medical history for hypertension, hepatitis C, tobacco and alcohol abuse, stage III chronic kidney disease, and paroxysmal atrial relation.  History of subdural hematoma 2018.  Recent hospitalization for symptomatic anemia, received 3 units PRBCs and IV iron.  Post hospitalization patient had persistent dyspnea, and dry cough, which where worsening and associated with pedal edema.  Reported significant weight loss 30 pounds over the last 3 to 4 months.  He was seen by his primary care provider, recommend him to come to the emergency department due to fluid overload.  His initial physical examination blood pressure 202/118, heart rate 88, temperature 97.8, oxygen saturation 90%, he had moist mucous membranes, his lungs had decreased breath sounds bilaterally, no rhonchi, heart S1-S2 present, rhythmic, no gallops, rubs or murmurs, the abdomen soft nontender, positive pedal edema.  Sodium 133, potassium 3.2, chloride 102, bicarb 22, glucose 76, BUN 22, creatinine 1.98, AST 119, ALT 45, total bilirubin 0.6, white count 6.0, hemoglobin 11.8, hematocrit 38.8, platelets 276.  CT chest with bilateral pleural effusions with associated atelectasis, left lower lobe infiltrate.  Bilateral upper lobe consolidative nodules.  Chest radiograph with hyperinflation, positive bilateral pleural effusions more left than right, left lower lobe infiltrate.  EKG sinus rhythm, left axis deviation, left ventricular hypertrophy, poor R wave progression, normal intervals.  Patient was admitted to the hospital working diagnosis of COPD exacerbation complicated by left lower lobe pneumonia, bilateral pleural effusions, cardiogenic pulmonary edema and bilateral apical pulmonary nodules, to rule out mycobacterial  infection and malignancy.   Assessment & Plan:   Active Problems:   Hyponatremia   Chronic viral hepatitis C (HCC)   Severe anemia   CKD (chronic kidney disease)   Pleural effusion, bilateral  1. Left lower lobe pneumonia complicated with COPD exacerbation (present on admission). Antibiotic therapy with IV ceftriaxone and po azithromycin, continue oxymetry monitoring and supplemental -02 per [place]. Will add bronchodilator therapy and systemic steroids. Induced sputum for culture, continue on respiratory isolation, until negative AFB. Patient with apical lesions and weight loss. Check HIV.   2. Acute on chronic diastolic heart failure exacerbation with bilateral pleural effusions. Will add 40 mg of IV furosemide and will follow on urine output, patient had thoracentesis 1600 ml fluid, flow on fluid analysis.   3. CKD stage 3. Renal function stable with serum cr at 1.99, with k at 3,8 and serum bicarbonate at 26. Will follow on renal panel in am. Will use furosemide for diuresis.   4, HTN. Continue blood pressure control with amlodipine, metoprolol and hydralazine.   5. Bilateral apical pulmonary nodules. Will need further work up, will follow with pulmonary recommendations.   DVT prophylaxis: enoxaparin   Code Status: full  Family Communication: no family at the bedside  Disposition Plan/ discharge barriers: pending clinical improvement   Consultants:   Pulmonary   Procedures:     Antimicrobials:       Subjective: Patient with persistent dyspnea, not back to his baseline, continue to have cough, dry, no chest pain, no nausea or vomiting.   Objective: Vitals:   06/27/18 2053 06/27/18 2316 06/28/18 0559 06/28/18 0924  BP: (!) 203/93 (!) 154/89 (!) 184/97 (!) 149/80  Pulse: (!) 102  100   Resp: (!) 21  19   Temp: 99 F (  37.2 C)  98.2 F (36.8 C)   TempSrc: Oral  Oral   SpO2: 100%  98%   Weight:      Height:        Intake/Output Summary (Last 24 hours) at 06/28/2018  1308 Last data filed at 06/28/2018 [id number] Gross per 24 hour  Intake 268.33 ml  Output 200 ml  Net 68.33 ml   Filed Weights   06/27/18 1519  Weight: 54.4 kg (119 lb 14.9 oz)    Examination:   General: Not in pain or dyspnea, deconditioned and ill looking appearing  Neurology: Awake and alert, non focal  E ENT: positive pallor, no icterus, oral mucosa moist Cardiovascular: No JVD. S1-S2 present, rhythmic, no gallops, rubs, or murmurs. No lower extremity edema. Pulmonary:positive breath sounds bilaterally, poor air movement, expiratory wheezing, with no rhonchi but scattered rales. Gastrointestinal. Abdomen with no organomegaly, non tender, no rebound or guarding Skin. No rashes Musculoskeletal: no joint deformities     Data Reviewed: I have personally reviewed following labs and imaging studies  CBC: Recent Labs  Lab 06/27/18 0901  WBC 6.0  HGB 11.8*  HCT 38.8*  MCV 86.0  PLT 276   Basic Metabolic Panel: Recent Labs  Lab 06/27/18 0901 06/28/18 0353  NA 133* 136  K 3.2* 3.8  CL 102 102  CO2 22 26  GLUCOSE 76 94  BUN 22 24*  CREATININE 1.98* 1.99*  CALCIUM 8.2* 8.2*   GFR: Estimated Creatinine Clearance: 29.2 mL/min (A) (by C-G formula based on SCr of 1.99 mg/dL (H)). Liver Function Tests: Recent Labs  Lab 06/27/18 0901 06/28/18 0353  AST 119* 77*  ALT 45* 35  ALKPHOS 82 74  BILITOT 0.6 0.6  PROT 8.1 7.3  ALBUMIN 2.3* 2.1*   No results for input(s): LIPASE, AMYLASE in the last 168 hours. No results for input(s): AMMONIA in the last 168 hours. Coagulation Profile: No results for input(s): INR, PROTIME in the last 168 hours. Cardiac Enzymes: No results for input(s): CKTOTAL, CKMB, CKMBINDEX, TROPONINI in the last 168 hours. BNP (last 3 results) No results for input(s): PROBNP in the last 8760 hours. HbA1C: No results for input(s): HGBA1C in the last 72 hours. CBG: No results for input(s): GLUCAP in the last 168 hours. Lipid Profile: No results for  input(s): CHOL, HDL, LDLCALC, TRIG, CHOLHDL, LDLDIRECT in the last 72 hours. Thyroid Function Tests: No results for input(s): TSH, T4TOTAL, FREET4, T3FREE, THYROIDAB in the last 72 hours. Anemia Panel: No results for input(s): VITAMINB12, FOLATE, FERRITIN, TIBC, IRON, RETICCTPCT in the last 72 hours.    Radiology Studies: I have reviewed all of the imaging during this hospital visit personally     Scheduled Meds: . feeding supplement (ENSURE ENLIVE)  237 mL Oral BID BM  . furosemide  20 mg Intravenous Daily  . hydrALAZINE  50 mg Oral Q8H  . magnesium oxide  400 mg Oral Daily  . metoprolol succinate  100 mg Oral Daily  . pantoprazole  20 mg Oral Daily  . potassium chloride  20 mEq Oral Daily   Continuous Infusions: . sodium chloride 20 mL/hr (06/27/18 1435)     LOS: 1 day        Joseph Annett Gulaaniel Arrien, MD [facility] Pager [phone]

## 2018-06-28 NOTE — Progress Notes (Signed)
PROGRESS NOTE  [name]  ZOX:[id number] DOB: 06-30-1956 DOA: 06/27/2018 PCP: Joseph Perna, NP   Brief Narrative:  Joseph Nash is a 62 y.o. male with a past medical history significant for uncontrolled hypertension, hepatitis C, tobacco and alcohol abuse, stage 3 CKD, and paroxysmal atrial fibrillation (not on anticoagulation due to history of subdural hematoma in 2018). He was recently admitted to the hospital on 6/30 for anemia with a Hb of 4.1 - he also had SOB and significant weight loss at that time. He was given 3 units PRBC and IV iron, fecal occult blood test negative and CT abdomen negative for malignancy. He was discharged with instructions to follow up with GI outpatient for a colonoscopy. He presented to the emergency department on 7/18 complaining of worsening dyspnea and cough since discharge. He had a dry cough during last admission and now it is wet, but he cannot produce phlegm. He denied hemoptysis. 30 pound unintentional weight loss in the last 3-4 months. He saw his PCP last week who told him to go to the ED for "fluid around the heart."   In the emergency department, his vitals were BP 202/118, HR 88, Temp 97.8, SpO2 90%. Pertinent lab values: WBC 6.0, Hb 11.8, K 3.2, Na 133. sCr 1.98 (appears to be his baseline), albumin 2.3, BNP >4,500, CEA 5.7. Quantiferon pending. CXR showed small pleural effusions bilaterally. CT chest revealed bilateral large pleural effusions as well as bilateral upper lobe nodules raising suspicion for bronchogenic carcinoma. He was admitted for further evaluation and management  Subjective: Patient is not feeling very well today. His cough and SOB have not improved. His cough is dry in nature and he has a difficult time completing sentences without coughing. His SOB is both at rest and w/exertion. Some occasional wheezing. He cannot describe anything that worsens or improves his symptoms. He endorses some swelling in his feet that he does not  believe has improved with lasix. He reports his appetite is improving slightly. Denies any pain, nausea, vomiting, diarrhea, or palpitations.  Assessment & Plan:  Bilateral pleural effusions secondary to Diastolic Dysfunction - Continue diuresis with 40 IV lasix.  - Echo shows LVEF 55-60% and grade 2 diastolic dysfunction w/elevated filling pressures.  - Per pulmonology consult, consider thoracentesis if he does not improve with diuresis.   Acute COPD exacerbation - He is a long-standing smoker. Patient has not been diagnosed with COPD in the past, though his symptoms and CT are suggestive. - Duoneb treatments prn.  - 40 mg prednisone daily.   Lung nodules / weight loss   - Given weight loss and smoking history, lung carcinoma is on the differential.  - Appreciate pulmonology consult: CT is suggestive of old granulomatous infection with possible reactivation vs pneumonia superimposed on CHF. Awaiting quantiferon results to rule out tuberculosis. Sputum cultures if patient can produce. Consider bronchoscopy with BAL if he cannot produce sputum. - IV Rocephin and PO azithromycin to cover for possible infection. Monitor daily CBC.  Chronic Kidney Disease stage 3 - sCr stable at 1.99 today (this is his baseline) - Continue with Lasix. Hold HCTZ. May need to consider thoracentesis if he does not improve.   Paroxysmal atrial fibrillation  - No anticoagulation given history of subdural hematoma.  Iron Deficiency Anemia - Hb stable and improved (at 11.8 today) following 3 units PRBC and IV iron at last admission. - No signs of bleeding. He needs a colonoscopy eventually. Monitor Hb with daily CBC  Uncontrolled Hypertension  -  BP is improved with the addition of norvasc33m and hydralazine. Continue with hyzaar and metoprolol. Monitor closely.  DVT prophylaxis: SCD Code Status: FULL Family Communication: Niece [place]at bedside  Disposition Plan: Undetermined, likely at least 1-2 days,  pending SOB/Cough work up.  Consultants:   Pulmonary   Procedures:   Echocardiogram  Antimicrobials:   None  Objective: Vitals:   06/27/18 2053 06/27/18 2316 06/28/18 0559 06/28/18 0924  BP: (!) 203/93 (!) 154/89 (!) 184/97 (!) 149/80  Pulse: (!) 102  100   Resp: (!) 21  19   Temp: 99 F (37.2 C)  98.2 F (36.8 C)   TempSrc: Oral  Oral   SpO2: 100%  98%   Weight:      Height:        Intake/Output Summary (Last 24 hours) at 06/28/2018 1021 Last data filed at 06/28/2018 0603 Gross per 24 hour  Intake 268.33 ml  Output 200 ml  Net 68.33 ml   Filed Weights   06/27/18 1519  Weight: 54.4 kg (119 lb 14.9 oz)   Examination: General appearance: adult thin male, awake and alert. NAD.   HEENT: Anicteric, conjunctiva pink, lids and lashes normal. No nasal deformity, discharge, epistaxis. Skin: Warm and dry.  No jaundice.  No suspicious rashes or lesions. Cardiac: RRR, nl S1-S2, no murmurs appreciated. No JVD. Mild edema noted on feet bilaterally. No cyanosis. Distal pulses are intact bilaterally. Respiratory: Increased work of breathing with significant coughing. Decreased breath sounds throughout, some wheezing.  Abdomen: Abdomen soft and non-distended. No TTP. No ascites, masses, hepatosplenomegaly appreciated. Positive bowel sounds throughout. MSK: No deformities or effusions. Neuro: AOx4. Moves all extremities.   Data Reviewed: I have personally reviewed following labs and imaging studies:  CBC: Recent Labs  Lab 06/27/18 0901  WBC 6.0  HGB 11.8*  HCT 38.8*  MCV 86.0  PLT 2867  Basic Metabolic Panel: Recent Labs  Lab 06/27/18 0901 06/28/18 0353  NA 133* 136  K 3.2* 3.8  CL 102 102  CO2 22 26  GLUCOSE 76 94  BUN 22 24*  CREATININE 1.98* 1.99*  CALCIUM 8.2* 8.2*   GFR: Estimated Creatinine Clearance: 29.2 mL/min (A) (by C-G formula based on SCr of 1.99 mg/dL (H)). Liver Function Tests: Recent Labs  Lab 06/27/18 0901 06/28/18 0353  AST 119* 77*    ALT 45* 35  ALKPHOS 82 74  BILITOT 0.6 0.6  PROT 8.1 7.3  ALBUMIN 2.3* 2.1*   Urine analysis:    Component Value Date/Time   COLORURINE YELLOW 05/24/2017 0Clements06/14/2018 0821   LABSPEC 1.009 05/24/2017 0821   PHURINE 5.0 05/24/2017 0821   GLUCOSEU NEGATIVE 05/24/2017 0821   HGBUR SMALL (A) 05/24/2017 0821   BILIRUBINUR NEGATIVE 05/24/2017 0[place]06/14/2018 0821   PROTEINUR 100 (A) 05/24/2017 0821   NITRITE NEGATIVE 05/24/2017 0821   LEUKOCYTESUR NEGATIVE 05/24/2017 0821   Radiology Studies: Dg Chest 2 View  Result Date: 06/27/2018 CLINICAL DATA:  Cough and shortness of breath EXAM: CHEST - 2 VIEW COMPARISON:  June 09, 2018 in May 24, 2017 FINDINGS: There is patchy airspace consolidation in the inferior lingula and portions of the left lower lobe with small left pleural effusion. There is a minimal right pleural effusion. There are areas of atelectatic change in the right mid lung and right base regions. There is scarring in each apex region, stable. There is a degree of underlying interstitial thickening, potentially a degree of chronic interstitial  edema. Heart is upper normal in size with pulmonary vascularity normal. No evident adenopathy. No bone lesions. There are foci of coronary artery calcification. IMPRESSION: Small pleural effusions bilaterally, larger on the left than on the right. Consolidation involving portions of the inferior lingula and left lower lobe. Areas of atelectatic change on the right. Question chronic interstitial edema versus chronic inflammatory change leading to interstitial thickening. Heart remains upper normal in size. No evident adenopathy. There are foci of coronary artery calcification. Electronically Signed   By: Lowella Grip III M.D.   On: 06/27/2018 08:33   Ct Chest Wo Contrast  Addendum Date: 06/27/2018   ADDENDUM REPORT: 06/27/2018 15:38 ADDENDUM: Further clinical data provided. Upon PA interviewing  patient, no reported pulmonary surgeries; therefore the linear densities at the lung apices are felt to represent inflammatory calcifications. This would increase suspicion of pneumoconiosis/inflammatory process. Would also consider mycobacterium infection. Consider thoracentesis with cytology/microbiology if concern for malignancy. Findings conveyed to Joseph Nash, pulmonology PAon 06/27/2018 at15:37. Electronically Signed   By: Suzy Bouchard M.D.   On: 06/27/2018 15:38   Result Date: 06/27/2018 CLINICAL DATA:  C/o cough x 1 week  HC afib, HTN EXAM: CT CHEST WITHOUT CONTRAST TECHNIQUE: Multidetector CT imaging of the chest was performed following the standard protocol without IV contrast. COMPARISON:  None. FINDINGS: Cardiovascular: Coronary artery calcification and aortic atherosclerotic calcification. Mediastinum/Nodes: No axillary supraclavicular adenopathy Enlarged prevascular lymph node measures up to 2 cm short axis (image 64/3. Esophagus normal Lungs/Pleura: Surgical staples LEFT RIGHT lung apex. Along the staple line LEFT upper lobe there is nodular consolidation measuring 2.5 by 1.8 cm (image 30/4. In the RIGHT upper lobe more central peribronchial consolidation with air bronchograms measures 1.6 by 1.5 cm (image 50/4. There are large bilateral pleural effusions. There is atelectasis at the lung bases. Upper Abdomen: Limited view of the liver, kidneys, pancreas are unremarkable. Normal adrenal glands. Musculoskeletal: No aggressive osseous lesion IMPRESSION: 1. LEFT upper lobe and RIGHT upper lobe consolidative nodules with differential including bronchogenic carcinoma, pulmonary infection/inflammation, and pneumoconiosis. 2. Prior surgeries in the lung apices. 3. Large bilateral pleural effusions and mild interstitial edema in lower lobes are favored cardiogenic in nature 4. Consider bronchoscopy to evaluate the LEFT upper lobe lesion or outpatient FDG PET scan. Electronically Signed: By: Suzy Bouchard M.D. On: 06/27/2018 11:41   Scheduled Meds: . feeding supplement (ENSURE ENLIVE)  237 mL Oral BID BM  . furosemide  20 mg Intravenous Daily  . hydrALAZINE  50 mg Oral Q8H  . magnesium oxide  400 mg Oral Daily  . metoprolol succinate  100 mg Oral Daily  . pantoprazole  20 mg Oral Daily  . potassium chloride  20 mEq Oral Daily   Continuous Infusions: . sodium chloride 20 mL/hr (06/27/18 1435)    LOS: 1 day   Time spent: 20 minutes spent with the patient.  Mandeep Ferch, PA-S [facility] 06/28/2018, 10:21 AM   [email or web address] Password [email or web address] If 7PM-7AM, please contact night-coverage

## 2018-06-28 NOTE — Procedures (Signed)
Thoracentesis Procedure Note  Pre-operative Diagnosis: Bl effusions  Post-operative Diagnosis: same  Indications: Left large pleural effusion  Procedure Details  Consent: Informed consent was obtained. Risks of the procedure were discussed including: infection, bleeding, pain, pneumothorax.  Under sterile conditions the patient was positioned. Betadine solution and sterile drapes were utilized.  2% buffered lidocaine was used to anesthetize the left 7th rib space. Fluid was obtained without any difficulties and minimal blood loss.  A dressing was applied to the wound and wound care instructions were provided.   Findings 1600 ml of clear pleural fluid was obtained. A sample was sent to Pathology for cytology, LDH, protein and cell counts, as well as for infection analysis.  Complications:  None; patient tolerated the procedure well.          Condition: stable  Plan A follow up chest x-ray was ordered. Bed Rest for 2 hours. Tylenol 650 mg. for pain.  Attending Attestation: I performed the procedure.  Comer Locketakesh V. Vassie LollAlva MD

## 2018-06-28 NOTE — Progress Notes (Signed)
  Echocardiogram 2D Echocardiogram has been performed.  Roosvelt MaserLane, Jerzie Bieri F 06/28/2018, 1:59 PM

## 2018-06-29 DIAGNOSIS — J441 Chronic obstructive pulmonary disease with (acute) exacerbation: Secondary | ICD-10-CM

## 2018-06-29 DIAGNOSIS — J181 Lobar pneumonia, unspecified organism: Secondary | ICD-10-CM

## 2018-06-29 DIAGNOSIS — N179 Acute kidney failure, unspecified: Secondary | ICD-10-CM

## 2018-06-29 DIAGNOSIS — I10 Essential (primary) hypertension: Secondary | ICD-10-CM

## 2018-06-29 LAB — BASIC METABOLIC PANEL
Anion gap: 6 (ref 5–15)
BUN: 32 mg/dL — AB (ref 8–23)
CO2: 26 mmol/L (ref 22–32)
Calcium: 8.1 mg/dL — ABNORMAL LOW (ref 8.9–10.3)
Chloride: 103 mmol/L (ref 98–111)
Creatinine, Ser: 2.16 mg/dL — ABNORMAL HIGH (ref 0.61–1.24)
GFR calc Af Amer: 36 mL/min — ABNORMAL LOW (ref >60)
GFR, EST NON AFRICAN AMERICAN: 31 mL/min — AB (ref >60)
GLUCOSE: 149 mg/dL — AB (ref 70–99)
POTASSIUM: 3.5 mmol/L (ref 3.5–5.1)
Sodium: 135 mmol/L (ref 135–145)

## 2018-06-29 LAB — CBC WITH DIFFERENTIAL/PLATELET
BASOS ABS: 0 10*3/uL (ref 0.0–0.1)
Basophils Relative: 0 %
EOS ABS: 0 10*3/uL (ref 0.0–0.7)
Eosinophils Relative: 0 %
HEMATOCRIT: 33.9 % — AB (ref 39.0–52.0)
Hemoglobin: 10.7 g/dL — ABNORMAL LOW (ref 13.0–17.0)
Lymphocytes Relative: 13 %
Lymphs Abs: 1.1 10*3/uL (ref 0.7–4.0)
MCH: 26.2 pg (ref 26.0–34.0)
MCHC: 31.6 g/dL (ref 30.0–36.0)
MCV: 83.1 fL (ref 78.0–100.0)
Monocytes Absolute: 0.5 10*3/uL (ref 0.1–1.0)
Monocytes Relative: 6 %
Neutro Abs: 6.8 10*3/uL (ref 1.7–7.7)
Neutrophils Relative %: 81 %
PLATELETS: 247 10*3/uL (ref 150–400)
RBC: 4.08 MIL/uL — ABNORMAL LOW (ref 4.22–5.81)
RDW: 26 % — ABNORMAL HIGH (ref 11.5–15.5)
WBC: 8.4 10*3/uL (ref 4.0–10.5)

## 2018-06-29 NOTE — Progress Notes (Signed)
PROGRESS NOTE    Joseph Nash  ZOX:[id number] DOB: [birth year] DOA: 06/27/2018 PCP: Grayce Sessions, NP    Brief Narrative:  62 year old male who presented with dyspnea and cough.  Patient does have the significant past medical history for hypertension, hepatitis C, tobacco and alcohol abuse, stage III chronic kidney disease, and paroxysmal atrial relation.  History of subdural hematoma 2018.  Recent hospitalization for symptomatic anemia, received 3 units PRBCs and IV iron.  Post hospitalization patient had persistent dyspnea, and dry cough, which where worsening and associated with pedal edema.  Reported significant weight loss 30 pounds over the last 3 to 4 months.  He was seen by his primary care provider, recommend him to come to the emergency department due to fluid overload.  His initial physical examination blood pressure 202/118, heart rate 88, temperature 97.8, oxygen saturation 90%, he had moist mucous membranes, his lungs had decreased breath sounds bilaterally, no rhonchi, heart S1-S2 present, rhythmic, no gallops, rubs or murmurs, the abdomen soft nontender, positive pedal edema.  Sodium 133, potassium 3.2, chloride 102, bicarb 22, glucose 76, BUN 22, creatinine 1.98, AST 119, ALT 45, total bilirubin 0.6, white count 6.0, hemoglobin 11.8, hematocrit 38.8, platelets 276.  CT chest with bilateral pleural effusions with associated atelectasis, left lower lobe infiltrate.  Bilateral upper lobe consolidative nodules.  Chest radiograph with hyperinflation, positive bilateral pleural effusions more left than right, left lower lobe infiltrate.  EKG sinus rhythm, left axis deviation, left ventricular hypertrophy, poor R wave progression, normal intervals.  Patient was admitted to the hospital working diagnosis of COPD exacerbation complicated by left lower lobe pneumonia, bilateral pleural effusions, cardiogenic pulmonary edema and bilateral apical pulmonary nodules, to rule out mycobacterial  infection and malignancy.   Assessment & Plan:   Active Problems:   Hyponatremia   Chronic viral hepatitis C (HCC)   Severe anemia   CKD (chronic kidney disease)   Pleural effusion, bilateral   1. Left lower lobe pneumonia complicated with COPD exacerbation (present on admission). Clinically has improved will continue antibiotic therapy with IV ceftriaxone and po azithromycin, oxymetry monitoring and supplemental -02 per [place]. Oxymetry at 98% on 2 LPM nasal cannula.  On bronchodilator therapy and prednisone 40 mg daily.  Not able to produce any sputum despite induction per respiratory therapy. On respiratory islolation.    2. Acute on chronic diastolic heart failure exacerbation with bilateral pleural effusions. Improved volume status, patient has thoracentesis yesterday, fluid analysis cw with transudate. Will hold on diuresis for now due to improvement in volume status and worsening renal function.   3. Pre renal AKI on CKD stage 3. Worsening renal function, likely due to diuresis, will continue to hold on furosemide, renal function with serum cr at 2,16 with K at 3,5 and serum bicarbonate at 26. Will follow on renal panel in am   4, HTN. On amlodipine, metoprolol and hydralazine for blood pressure control.    5. Bilateral apical pulmonary nodules. Will need further workup, possible bronchoscopy.   DVT prophylaxis: enoxaparin   Code Status: full  Family Communication: no family at the bedside  Disposition Plan/ discharge barriers: pending clinical improvement   Consultants:   Pulmonary   Procedures:     Antimicrobials:    Subjective: Patient is feeling better, dyspnea has improved, but not back to baseline, positive dry cough, no chest pain, no nausea or vomiting, has been out of bed.   Objective: Vitals:   06/28/18 1942 06/28/18 2037 06/29/18 8119 06/29/18 1478  BP: (!) 149/78  139/78   Pulse: (!) 105 100 (!) 101   Resp: 18 18 18    Temp: 99.6 F (37.6 C)   98.3 F (36.8 C)   TempSrc: Oral  Oral   SpO2: 100% 100% 100% 100%  Weight:      Height:        Intake/Output Summary (Last 24 hours) at 06/29/2018 1249 Last data filed at 06/29/2018 [id number] Gross per 24 hour  Intake 697 ml  Output -  Net 697 ml   Filed Weights   06/27/18 1519  Weight: 54.4 kg (119 lb 14.9 oz)    Examination:   General: Not in pain or dyspnea, deconditioned  Neurology: Awake and alert, non focal  E ENT: mild pallor, no icterus, oral mucosa moist Cardiovascular: No JVD. S1-S2 present, rhythmic, no gallops, rubs, or murmurs. No lower extremity edema. Pulmonary: positive breath sounds bilaterally, decreased air movement but improved ventilation compared to yesterday.  Mild expiratory wheezing with no rhonchi or rales. Gastrointestinal. Abdomen with no organomegaly, non tender, no rebound or guarding Skin. No rashes Musculoskeletal: no joint deformities     Data Reviewed: I have personally reviewed following labs and imaging studies  CBC: Recent Labs  Lab 06/27/18 0901 06/29/18 0520  WBC 6.0 8.4  NEUTROABS  --  6.8  HGB 11.8* 10.7*  HCT 38.8* 33.9*  MCV 86.0 83.1  PLT 276 247   Basic Metabolic Panel: Recent Labs  Lab 06/27/18 0901 06/28/18 0353 06/29/18 0520  NA 133* 136 135  K 3.2* 3.8 3.5  CL 102 102 103  CO2 22 26 26   GLUCOSE 76 94 149*  BUN 22 24* 32*  CREATININE 1.98* 1.99* 2.16*  CALCIUM 8.2* 8.2* 8.1*   GFR: Estimated Creatinine Clearance: 26.9 mL/min (A) (by C-G formula based on SCr of 2.16 mg/dL (H)). Liver Function Tests: Recent Labs  Lab 06/27/18 0901 06/28/18 0353 06/28/18 0446  AST 119* 77*  --   ALT 45* 35  --   ALKPHOS 82 74  --   BILITOT 0.6 0.6  --   PROT 8.1 7.3 7.3  ALBUMIN 2.3* 2.1*  --    No results for input(s): LIPASE, AMYLASE in the last 168 hours. No results for input(s): AMMONIA in the last 168 hours. Coagulation Profile: No results for input(s): INR, PROTIME in the last 168 hours. Cardiac Enzymes: No  results for input(s): CKTOTAL, CKMB, CKMBINDEX, TROPONINI in the last 168 hours. BNP (last 3 results) No results for input(s): PROBNP in the last 8760 hours. HbA1C: No results for input(s): HGBA1C in the last 72 hours. CBG: No results for input(s): GLUCAP in the last 168 hours. Lipid Profile: No results for input(s): CHOL, HDL, LDLCALC, TRIG, CHOLHDL, LDLDIRECT in the last 72 hours. Thyroid Function Tests: No results for input(s): TSH, T4TOTAL, FREET4, T3FREE, THYROIDAB in the last 72 hours. Anemia Panel: No results for input(s): VITAMINB12, FOLATE, FERRITIN, TIBC, IRON, RETICCTPCT in the last 72 hours.    Radiology Studies: I have reviewed all of the imaging during this hospital visit personally     Scheduled Meds: . amLODipine  10 mg Oral Daily  . azithromycin  500 mg Oral Daily  . feeding supplement (ENSURE ENLIVE)  237 mL Oral TID BM  . hydrALAZINE  50 mg Oral Q8H  . ipratropium-albuterol  3 mL Nebulization TID  . magnesium oxide  400 mg Oral Daily  . metoprolol succinate  100 mg Oral Daily  . multivitamin with minerals  1 tablet  Oral Daily  . pantoprazole  20 mg Oral Daily  . predniSONE  40 mg Oral Daily   Continuous Infusions: . sodium chloride 20 mL/hr (06/27/18 1435)  . cefTRIAXone (ROCEPHIN)  IV 1 g (06/28/18 1509)     LOS: 2 days        Kasee Hantz Annett Gula, MD [facility] Pager [phone]

## 2018-06-30 LAB — BASIC METABOLIC PANEL
ANION GAP: 9 (ref 5–15)
BUN: 42 mg/dL — ABNORMAL HIGH (ref 8–23)
CALCIUM: 8.4 mg/dL — AB (ref 8.9–10.3)
CO2: 27 mmol/L (ref 22–32)
Chloride: 99 mmol/L (ref 98–111)
Creatinine, Ser: 2.39 mg/dL — ABNORMAL HIGH (ref 0.61–1.24)
GFR, EST AFRICAN AMERICAN: 32 mL/min — AB (ref >60)
GFR, EST NON AFRICAN AMERICAN: 27 mL/min — AB (ref >60)
GLUCOSE: 120 mg/dL — AB (ref 70–99)
POTASSIUM: 3.6 mmol/L (ref 3.5–5.1)
Sodium: 135 mmol/L (ref 135–145)

## 2018-06-30 LAB — CBC WITH DIFFERENTIAL/PLATELET
BASOS ABS: 0 10*3/uL (ref 0.0–0.1)
Basophils Relative: 0 %
EOS PCT: 0 %
Eosinophils Absolute: 0 10*3/uL (ref 0.0–0.7)
HCT: 33.1 % — ABNORMAL LOW (ref 39.0–52.0)
Hemoglobin: 10.2 g/dL — ABNORMAL LOW (ref 13.0–17.0)
LYMPHS ABS: 1.6 10*3/uL (ref 0.7–4.0)
Lymphocytes Relative: 16 %
MCH: 26.4 pg (ref 26.0–34.0)
MCHC: 30.8 g/dL (ref 30.0–36.0)
MCV: 85.8 fL (ref 78.0–100.0)
MONO ABS: 1.2 10*3/uL — AB (ref 0.1–1.0)
Monocytes Relative: 12 %
NEUTROS PCT: 72 %
Neutro Abs: 7 10*3/uL (ref 1.7–7.7)
PLATELETS: 288 10*3/uL (ref 150–400)
RBC: 3.86 MIL/uL — AB (ref 4.22–5.81)
RDW: 26.7 % — AB (ref 11.5–15.5)
WBC: 9.8 10*3/uL (ref 4.0–10.5)

## 2018-06-30 NOTE — Progress Notes (Signed)
PROGRESS NOTE    SAMAN UMSTEAD  ZOX:[id number] DOB: 08-09-1956 DOA: 06/27/2018 PCP: Grayce Sessions, NP    Brief Narrative:  62 year old male who presented with dyspneaandcough.Patient does have the significant past medical history for hypertension, hepatitis C, tobacco and alcohol abuse, stage III chronic kidney disease, and paroxysmal atrial relation. History of subdural hematoma 2018. Recent hospitalization for symptomatic anemia, received 3 units PRBCs and IV iron. Post hospitalization patient had persistent dyspnea, and dry cough, which where worsening andassociated with pedal edema.Reported significant weight loss 30 pounds over the last 3 to 4 months. He was seen by his primary care provider, recommend him to come to the emergency department due to fluid overload. His initial physical examination blood pressure 202/118, heart rate 88, temperature 97.8, oxygen saturation 90%, he had moist mucous membranes, his lungs had decreased breath sounds bilaterally, no rhonchi, heart S1-S2 present, rhythmic, no gallops, rubs or murmurs, the abdomen soft nontender, positive pedal edema. Sodium 133, potassium 3.2, chloride 102, bicarb 22, glucose 76, BUN 22, creatinine 1.98, AST 119, ALT 45, total bilirubin 0.6, white count 6.0, hemoglobin 11.8, hematocrit 38.8, platelets 276.CT chest with bilateral pleural effusions with associated atelectasis, left lower lobe infiltrate. Bilateral upper lobe consolidative nodules.Chest radiograph with hyperinflation, positive bilateral pleural effusions more left than right, left lower lobe infiltrate. EKG sinus rhythm, left axis deviation, left ventricular hypertrophy,poor R wave progression, normal intervals.  Patient was admitted to the hospital working diagnosis of COPD exacerbation complicated by left lower lobe pneumonia,bilateral pleural effusions, cardiogenicpulmonary edema andbilateral apical pulmonary nodules,to rule out mycobacterial  infection and malignancy  Assessment & Plan:   Active Problems:   Hyponatremia   Chronic viral hepatitis C (HCC)   Severe anemia   CKD (chronic kidney disease)   Pleural effusion, bilateral   1. Left lower lobe pneumonia complicated with COPD exacerbation (present on admission). On antibiotic therapy with IV ceftriaxone and po azithromycin, continue to improve symptoms. Oxymetry monitoring and supplemental -02 per [place]. Continue with bronchodilator therapy and prednisone 40 mg daily. Unable to give sputum sample, patient likely will need diagnostic bronchoscopy. Continue respiratory isolation for now. Will follow on urine strep pneumo and legionella antigens.    2. Acute on chronic diastolic heart failure exacerbation with bilateral transudate pleural effusions. Currently holding diuresis due to AKI. Echocardiogram with preserved systolic LV function, no signs of pulmonary HTN.   3. Pre renal AKI on CKD stage 3. rising cr at 2,39 from 2,16, K at 3,6 and serum bicarbonate at 27. Will continue to avoid nephrotoxic medications and hypotension. Urine output not recorded, will ask strict in and out monitoring.   4, HTN. Continue with amlodipine, metoprolol and hydralazine.   5. Bilateral apical pulmonary nodules. Pending further workup, possible bronchoscopy on this admission.   6. Severe calorie protein malnutrition. Continue nutritional supplements with ensure, magic cup and multivitamins.   DVT prophylaxis:enoxaparin Code Status:full Family Communication:no family at the bedside Disposition Plan/ discharge barriers:pending clinical improvement   Consultants:  Pulmonary  Procedures:    Antimicrobials:   Subjective: Patient continue to improve in his symptoms, not back to baseline, not able to give sputum sample due to dry cough, no nausea or vomiting, no chest pain or edema. Has been ambulating to the restroom.   Objective: Vitals:   06/29/18 2041  06/29/18 2245 06/30/18 0635 06/30/18 0756  BP: (!) 124/56  125/66   Pulse: (!) 129  99   Resp:  19 19   Temp:  98 F (  36.7 C) 98 F (36.7 C)   TempSrc:   Oral   SpO2: 100%  99% 98%  Weight:      Height:        Intake/Output Summary (Last 24 hours) at 06/30/2018 1101 Last data filed at 06/30/2018 1037 Gross per 24 hour  Intake 1840 ml  Output -  Net 1840 ml   Filed Weights   06/27/18 1519  Weight: 54.4 kg (119 lb 14.9 oz)    Examination:   General: Not in pain or dyspnea, deconditioned  Neurology: Awake and alert, non focal  E ENT: mild pallor, no icterus, oral mucosa moist Cardiovascular: No JVD. S1-S2 present, rhythmic, no gallops, rubs, or murmurs. No lower extremity edema. Pulmonary: decreased breath sounds bilaterally,with no wheezing or rhonchi, scattered rales. Gastrointestinal. Abdomen with no organomegaly, non tender, no rebound or guarding Skin. No rashes Musculoskeletal: no joint deformities     Data Reviewed: I have personally reviewed following labs and imaging studies  CBC: Recent Labs  Lab 06/27/18 0901 06/29/18 0520 06/30/18 0825  WBC 6.0 8.4 9.8  NEUTROABS  --  6.8 7.0  HGB 11.8* 10.7* 10.2*  HCT 38.8* 33.9* 33.1*  MCV 86.0 83.1 85.8  PLT 276 247 288   Basic Metabolic Panel: Recent Labs  Lab 06/27/18 0901 06/28/18 0353 06/29/18 0520 06/30/18 0825  NA 133* 136 135 135  K 3.2* 3.8 3.5 3.6  CL 102 102 103 99  CO2 22 26 26 27   GLUCOSE 76 94 149* 120*  BUN 22 24* 32* 42*  CREATININE 1.98* 1.99* 2.16* 2.39*  CALCIUM 8.2* 8.2* 8.1* 8.4*   GFR: Estimated Creatinine Clearance: 24.3 mL/min (A) (by C-G formula based on SCr of 2.39 mg/dL (H)). Liver Function Tests: Recent Labs  Lab 06/27/18 0901 06/28/18 0353 06/28/18 0446  AST 119* 77*  --   ALT 45* 35  --   ALKPHOS 82 74  --   BILITOT 0.6 0.6  --   PROT 8.1 7.3 7.3  ALBUMIN 2.3* 2.1*  --    No results for input(s): LIPASE, AMYLASE in the last 168 hours. No results for input(s):  AMMONIA in the last 168 hours. Coagulation Profile: No results for input(s): INR, PROTIME in the last 168 hours. Cardiac Enzymes: No results for input(s): CKTOTAL, CKMB, CKMBINDEX, TROPONINI in the last 168 hours. BNP (last 3 results) No results for input(s): PROBNP in the last 8760 hours. HbA1C: No results for input(s): HGBA1C in the last 72 hours. CBG: No results for input(s): GLUCAP in the last 168 hours. Lipid Profile: No results for input(s): CHOL, HDL, LDLCALC, TRIG, CHOLHDL, LDLDIRECT in the last 72 hours. Thyroid Function Tests: No results for input(s): TSH, T4TOTAL, FREET4, T3FREE, THYROIDAB in the last 72 hours. Anemia Panel: No results for input(s): VITAMINB12, FOLATE, FERRITIN, TIBC, IRON, RETICCTPCT in the last 72 hours.    Radiology Studies: I have reviewed all of the imaging during this hospital visit personally     Scheduled Meds: . amLODipine  10 mg Oral Daily  . azithromycin  500 mg Oral Daily  . feeding supplement (ENSURE ENLIVE)  237 mL Oral TID BM  . hydrALAZINE  50 mg Oral Q8H  . ipratropium-albuterol  3 mL Nebulization TID  . magnesium oxide  400 mg Oral Daily  . metoprolol succinate  100 mg Oral Daily  . multivitamin with minerals  1 tablet Oral Daily  . pantoprazole  20 mg Oral Daily  . predniSONE  40 mg Oral Daily  Continuous Infusions: . sodium chloride 20 mL/hr (06/27/18 1435)  . cefTRIAXone (ROCEPHIN)  IV 1 g (06/29/18 1557)     LOS: 3 days        Mauricio Annett Gula, MD [facility] Pager [phone]

## 2018-07-01 DIAGNOSIS — N183 Chronic kidney disease, stage 3 (moderate): Secondary | ICD-10-CM

## 2018-07-01 DIAGNOSIS — R9389 Abnormal findings on diagnostic imaging of other specified body structures: Secondary | ICD-10-CM

## 2018-07-01 LAB — BASIC METABOLIC PANEL
ANION GAP: 9 (ref 5–15)
BUN: 41 mg/dL — ABNORMAL HIGH (ref 8–23)
CHLORIDE: 97 mmol/L — AB (ref 98–111)
CO2: 26 mmol/L (ref 22–32)
CREATININE: 2.44 mg/dL — AB (ref 0.61–1.24)
Calcium: 8.4 mg/dL — ABNORMAL LOW (ref 8.9–10.3)
GFR calc non Af Amer: 27 mL/min — ABNORMAL LOW (ref >60)
GFR, EST AFRICAN AMERICAN: 31 mL/min — AB (ref >60)
Glucose, Bld: 104 mg/dL — ABNORMAL HIGH (ref 70–99)
POTASSIUM: 3.7 mmol/L (ref 3.5–5.1)
Sodium: 132 mmol/L — ABNORMAL LOW (ref 135–145)

## 2018-07-01 LAB — STREP PNEUMONIAE URINARY ANTIGEN: Strep Pneumo Urinary Antigen: NEGATIVE

## 2018-07-01 LAB — LEGIONELLA PNEUMOPHILA SEROGP 1 UR AG: L. PNEUMOPHILA SEROGP 1 UR AG: NEGATIVE

## 2018-07-01 MED ORDER — FUROSEMIDE 10 MG/ML IJ SOLN
40.0000 mg | Freq: Four times a day (QID) | INTRAMUSCULAR | Status: AC
  Administered 2018-07-01: 40 mg via INTRAVENOUS
  Filled 2018-07-01: qty 4

## 2018-07-01 NOTE — Progress Notes (Signed)
PROGRESS NOTE  FACUNDO ALLEMAND  UEA:[id number] DOB: 1956-08-10 DOA: 06/27/2018 PCP: Grayce Sessions, NP   Brief Narrative:  [name] is a 62 y.o. male with a past medical history significant for uncontrolled hypertension, hepatitis C, tobacco and alcohol abuse, stage 3 CKD, and paroxysmal atrial fibrillation (not on anticoagulation due to history of subdural hematoma in 2018). He was recently admitted to the hospital on 6/30 for anemia with a Hb of 4.1 - he also had SOB and significant weight loss at that time. He was given 3 units PRBC and IV iron, fecal occult blood test negative and CT abdomen negative for malignancy. He was discharged with instructions to follow up with GI outpatient for a colonoscopy. He presented to the emergency department on 7/18 complaining of worsening dyspnea and cough since discharge. He had a dry cough during last admission and now it is wet, but he cannot produce phlegm. He denied hemoptysis. 30 pound unintentional weight loss in the last 3-4 months. He saw his PCP last week who told him to go to the ED for "fluid around the heart."   In the emergency department, his vitals were BP 202/118, HR 88, Temp 97.8, SpO2 90%. Pertinent lab values: WBC 6.0, Hb 11.8, K 3.2, Na 133. sCr 1.98 (appears to be his baseline), albumin 2.3, BNP >4,500, CEA 5.7. Quantiferon pending. CXR showed small pleural effusions bilaterally. CT chest revealed bilateral large pleural effusionsas well as bilateral upper lobe nodules raising suspicion for bronchogenic carcinoma. He was admitted for further evaluation and management  Subjective: Patient continues with persistent bothersome dry coughing that is keeping him up at night. He believes the antibiotics and nebulizer treatments have helped his symptoms very slightly. He reports he is not eating well because his appetite is down. He had an episode of diarrhea after drinking an ensure, but has had normal BMs since. He reports resolved lower  extremity edema. No CP, nausea, vomiting, palpations, fevers or chills.  Assessment & Plan:  Left lower lobe pneumonia, complicated with COPD exacerbation - Continue IV ceftriaxone and PO azithromycin, symptoms slowly improving. - Pulse oxymetry monitoring and supplemental O2 - Continue Duoneb and prednisone 40 mg daily - Unable to give sputum sample, patient is scheduled for a bronchoscopy 07/02/18 at 8am. - Continue respiratory isolation. Urine strep pneumo and legionella antigens: pending.  Acute on chronic diastolic heart failure exacerbation with bilateral transudate pleural effusions. - Lasix given today per pulmonology. Will hold off on further diuresis due to AKI. - Echocardiogram with preserved systolic LV function, no signs of pulmonary HTN.   Pre renal AKI onCKD stage III - sCr continues to rise at 2.44 today from 2.39. K at 3.7  - Continue to avoid nephrotoxic medications and hypotension. Continue gentle IVF. - Monitor with daily BMP.  HTN - Continue norvasc, metoprolol, and hydralazine. Monitor BP closely  Bilateral apical pulmonary nodules - Pending further workup - bronchoscopy scheduled for 07/02/18  Severe calorie protein malnutrition - Continue nutritional supplements with ensure, magic cup, and multivitamins.   DVT prophylaxis: Lovenox Code Status: FULL Family Communication: no family at bedside Disposition Plan: unclear; pending clinical improvement.   Consultants:   Pulmonary   Procedures:   Bronchoscopy scheduled 7/23  Thoracentesis  Antimicrobials:   PO azithromycin   IV ceftriaxone  Objective: Vitals:   07/01/18 0359 07/01/18 0800 07/01/18 0837 07/01/18 0905  BP: (!) 151/87  (!) 147/74   Pulse: 98  (!) 107   Resp:   (!) 24  Temp: 98.2 F (36.8 C)  98.2 F (36.8 C)   TempSrc: Oral  Oral   SpO2: 100% 100% 100% 98%  Weight:      Height:        Intake/Output Summary (Last 24 hours) at 07/01/2018 1139 Last data filed at  07/01/2018 0800 Gross per 24 hour  Intake 564 ml  Output -  Net 564 ml   Filed Weights   06/27/18 1519  Weight: 54.4 kg (119 lb 14.9 oz)    Examination: General appearance: adult thin male, awake and alert. NAD.   HEENT: Anicteric, conjunctiva pink, lids and lashes normal. No nasal deformity, discharge, epistaxis. Skin: Warm and dry.  No jaundice.  No suspicious rashes or lesions. Cardiac: RRR, nl S1-S2, no murmurs appreciated. No JVD. No peripheral edema. No cyanosis. Distal pulses are intact bilaterally. Respiratory: Increased work of breathing with significant coughing. Decreased breath sounds throughout. No rales, crackles or wheezes Abdomen: Abdomen soft and non-distended. No TTP. No ascites, masses, hepatosplenomegaly appreciated. Positive bowel sounds throughout. MSK: No deformities or effusions. Neuro: AOx4. Moves all extremities.   Data Reviewed: I have personally reviewed following labs and imaging studies:  CBC: Recent Labs  Lab 06/27/18 0901 06/29/18 0520 06/30/18 0825  WBC 6.0 8.4 9.8  NEUTROABS  --  6.8 7.0  HGB 11.8* 10.7* 10.2*  HCT 38.8* 33.9* 33.1*  MCV 86.0 83.1 85.8  PLT 276 247 288   Basic Metabolic Panel: Recent Labs  Lab 06/27/18 0901 06/28/18 0353 06/29/18 0520 06/30/18 0825 07/01/18 0507  NA 133* 136 135 135 132*  K 3.2* 3.8 3.5 3.6 3.7  CL 102 102 103 99 97*  CO2 22 26 26 27 26   GLUCOSE 76 94 149* 120* 104*  BUN 22 24* 32* 42* 41*  CREATININE 1.98* 1.99* 2.16* 2.39* 2.44*  CALCIUM 8.2* 8.2* 8.1* 8.4* 8.4*   GFR: Estimated Creatinine Clearance: 23.8 mL/min (A) (by C-G formula based on SCr of 2.44 mg/dL (H)). Liver Function Tests: Recent Labs  Lab 06/27/18 0901 06/28/18 0353 06/28/18 0446  AST 119* 77*  --   ALT 45* 35  --   ALKPHOS 82 74  --   BILITOT 0.6 0.6  --   PROT 8.1 7.3 7.3  ALBUMIN 2.3* 2.1*  --    Urine analysis:    Component Value Date/Time   COLORURINE YELLOW 05/24/2017 0821   APPEARANCEUR CLEAR 05/24/2017  0821   LABSPEC 1.009 05/24/2017 0821   PHURINE 5.0 05/24/2017 0821   GLUCOSEU NEGATIVE 05/24/2017 0821   HGBUR SMALL (A) 05/24/2017 0821   BILIRUBINUR NEGATIVE 05/24/2017 0821   KETONESUR NEGATIVE 05/24/2017 0821   PROTEINUR 100 (A) 05/24/2017 0821   NITRITE NEGATIVE 05/24/2017 0821   LEUKOCYTESUR NEGATIVE 05/24/2017 0821   Recent Results (from the past 240 hour(s))  Body fluid culture     Status: None (Preliminary result)   Collection Time: 06/28/18  5:28 PM  Result Value Ref Range Status   Specimen Description FLUID LEFT PLEURAL  Final   Special Requests NONE  Final   Gram Stain   Final    FEW WBC PRESENT, PREDOMINANTLY MONONUCLEAR NO ORGANISMS SEEN    Culture   Final    NO GROWTH 3 DAYS Performed at [facility], [address] [place], [place] [address]    Report Status PENDING  Incomplete     Scheduled Meds: . amLODipine  10 mg Oral Daily  . azithromycin  500 mg Oral Daily  . feeding supplement (ENSURE  ENLIVE)  237 mL Oral TID BM  . furosemide  40 mg Intravenous Q6H  . hydrALAZINE  50 mg Oral Q8H  . ipratropium-albuterol  3 mL Nebulization TID  . magnesium oxide  400 mg Oral Daily  . metoprolol succinate  100 mg Oral Daily  . multivitamin with minerals  1 tablet Oral Daily  . predniSONE  40 mg Oral Daily   Continuous Infusions: . sodium chloride 20 mL/hr (06/27/18 1435)  . cefTRIAXone (ROCEPHIN)  IV Stopped (06/30/18 1541)     LOS: 4 days   Azekiel Cremer, PA-S [facility] 07/01/2018, 11:39 AM   [email or web address] Password [email or web address] If 7PM-7AM, please contact night-coverage

## 2018-07-01 NOTE — Progress Notes (Signed)
Joseph Nash  Admitted last week for dyspnea and cough with mucus production. Notes he may have lost weight in the last year.  He thinks that if he ever had hemoptysis it was only in the context of epistaxis.  Subjective:  Feels about the same, still coughing a lot, not producing mucus.  O: Vitals:   07/01/18 0359 07/01/18 0800 07/01/18 0837 07/01/18 0905  BP: (!) 151/87  (!) 147/74   Pulse: 98  (!) 107   Resp:   (!) 24   Temp: 98.2 F (36.8 C)  98.2 F (36.8 C)   TempSrc: Oral  Oral   SpO2: 100% 100% 100% 98%  Weight:      Height:       2L [place]  General:  Resting comfortably in bed HENT: NCAT OP clear PULM: CTA B, normal effort CV: RRR, no mgr, JVD to angle of jaw GI: BS+, soft, nontender MSK: normal bulk and tone Neuro: awake, alert, no distress, MAEW  CBC    Component Value Date/Time   WBC 9.8 06/30/2018 0825   RBC 3.86 (L) 06/30/2018 0825   HGB 10.2 (L) 06/30/2018 0825   HCT 33.1 (L) 06/30/2018 0825   PLT 288 06/30/2018 0825   MCV 85.8 06/30/2018 0825   MCH 26.4 06/30/2018 0825   MCHC 30.8 06/30/2018 0825   RDW 26.7 (H) 06/30/2018 0825   LYMPHSABS 1.6 06/30/2018 0825   MONOABS 1.2 (H) 06/30/2018 0825   EOSABS 0.0 06/30/2018 0825   BASOSABS 0.0 06/30/2018 0825   BMET    Component Value Date/Time   NA 132 (L) 07/01/2018 0507   K 3.7 07/01/2018 0507   CL 97 (L) 07/01/2018 0507   CO2 26 07/01/2018 0507   GLUCOSE 104 (H) 07/01/2018 0507   BUN 41 (H) 07/01/2018 0507   CREATININE 2.44 (H) 07/01/2018 0507   CALCIUM 8.4 (L) 07/01/2018 0507   GFRNONAA 27 (L) 07/01/2018 0507   GFRAA 31 (L) 07/01/2018 0507     CT images reviewed: RUL and LUL central/bronchus-based scarring bilaterally, ground glass bases, L>R large pleural effusions  Pleural fluid analysis: minimal WBC's, LDH slightly elevated, protein normal/low  Echo: normal LVEF, Grade 2 DD  Impression/plan:  Bilateral transudative effusions: this is due to HFpEF exacerbation: needs lasix, will order 40mg   IV q6h x2 doses now  Bilateral upper lobe consolidation/scarring: needs bronch to rule out TB (though I think this is probably scarring from an old infection and not TB), [facility]  CKD: worsening Cr but appears volume up on physical exam (JVD to angle of jaw): give lasix  Heber CarolinaBrent McQuaid, MD [place] [facility] Pager: [phone] Cell: [phone] After 3pm or if no response, call [phone]

## 2018-07-01 NOTE — Progress Notes (Signed)
PROGRESS NOTE    Joseph Nash  EAV:[id number] DOB: 06/29/1956 DOA: 06/27/2018 PCP: Grayce SessionsEdwards, Michelle P, NP    Brief Narrative:  [age] year old male who presented with dyspneaandcough.Patient does have the significant past medical history for hypertension, hepatitis C, tobacco and alcohol abuse, stage III chronic kidney disease, and paroxysmal atrial relation. History of subdural hematoma 2018. Recent hospitalization for symptomatic anemia, received 3 units PRBCs and IV iron. Post hospitalization patient had persistent dyspnea, and dry cough, which where worsening andassociated with pedal edema.Reported significant weight loss 30 pounds over the last 3 to 4 months. He was seen by his primary care provider, recommend him to come to the emergency department due to fluid overload. His initial physical examination blood pressure 202/118, heart rate 88, temperature 97.8, oxygen saturation 90%, he had moist mucous membranes, his lungs had decreased breath sounds bilaterally, no rhonchi, heart S1-S2 present, rhythmic, no gallops, rubs or murmurs, the abdomen soft nontender, positive pedal edema. Sodium 133, potassium 3.2, chloride 102, bicarb 22, glucose 76, BUN 22, creatinine 1.98, AST 119, ALT 45, total bilirubin 0.6, white count 6.0, hemoglobin 11.8, hematocrit 38.8, platelets 276.CT chest with bilateral pleural effusions with associated atelectasis, left lower lobe infiltrate. Bilateral upper lobe consolidative nodules.Chest radiograph with hyperinflation, positive bilateral pleural effusions more left than right, left lower lobe infiltrate. EKG sinus rhythm, left axis deviation, left ventricular hypertrophy,poor R wave progression, normal intervals.  Patient was admitted to the hospital working diagnosis of COPD exacerbation complicated by left lower lobe pneumonia,bilateral pleural effusions, cardiogenicpulmonary edema andbilateral apical pulmonary nodules,to rule out mycobacterial  infection and malignancy  Assessment & Plan:   Active Problems:   Hyponatremia   Chronic viral hepatitis C (HCC)   Severe anemia   CKD (chronic kidney disease)   Pleural effusion, bilateral   1. Left lower lobe pneumonia complicated with COPD exacerbation (present on admission).Continue antibiotic therapy with IV ceftriaxone and po azithromycin. Oxymetry monitoring and supplemental -02 per [place]. Bronchodilator therapy and prednisone 40 mg daily. Plan for diagnostic bronchoscopy in am. Continue respiratory isolation.   2. Acute on chronic diastolic heart failure exacerbation with bilateral transudate pleural effusions. Blood pressure has remained stable. Effusions can be related to hypoproteinemia, more than to heart failure.   3.Pre renal AKI onCKD stage 3.Worsening renal function, will continue to avoid nephrotoxic medications and hypotension, follow on renal panel in am.  4, HTN.On amlodipine, metoprolol and hydralazine.   5. Bilateral apical pulmonary nodules.Plan for diagnostic bronchoscopy in am.  6. Severe calorie protein malnutrition. Nutritional supplements with ensure, magic cup and multivitamins.   DVT prophylaxis:enoxaparin Code Status:full Family Communication:no family at the bedside Disposition Plan/ discharge barriers:pending clinical improvement   Consultants:  Pulmonary  Procedures:    Antimicrobials:   Subjective: Patient is feeling better, continue to have cough, dyspnea not at baseline, "50% to 50%", no nausea or vomiting.   Objective: Vitals:   07/01/18 0837 07/01/18 0905 07/01/18 1240 07/01/18 1535  BP: (!) 147/74  132/71   Pulse: (!) 107  99   Resp: (!) 24  (!) 22   Temp: 98.2 F (36.8 C)  97.8 F (36.6 C)   TempSrc: Oral  Oral   SpO2: 100% 98% 99% 98%  Weight:      Height:        Intake/Output Summary (Last 24 hours) at 07/01/2018 1628 Last data filed at 07/01/2018 1100 Gross per 24 hour  Intake 472 ml    Output -  Net 472 ml   Filed  Weights   06/27/18 1519  Weight: 54.4 kg (119 lb 14.9 oz)    Examination:   General: Not in pain or dyspnea, deconditioned and ill looking appearing Neurology: Awake and alert, non focal  E ENT: mild pallor, no icterus, oral mucosa moist Cardiovascular: No JVD. S1-S2 present, rhythmic, no gallops, rubs, or murmurs. No lower extremity edema. Pulmonary: positive breath sounds bilaterally, decreased air movement, no wheezing, or rhonchi, scattered rales. Prolonged expiratory phase.  Gastrointestinal. Abdomen with no organomegaly, non tender, no rebound or guarding Skin. No rashes Musculoskeletal: no joint deformities     Data Reviewed: I have personally reviewed following labs and imaging studies  CBC: Recent Labs  Lab 06/27/18 0901 06/29/18 0520 06/30/18 0825  WBC 6.0 8.4 9.8  NEUTROABS  --  6.8 7.0  HGB 11.8* 10.7* 10.2*  HCT 38.8* 33.9* 33.1*  MCV 86.0 83.1 85.8  PLT 276 247 288   Basic Metabolic Panel: Recent Labs  Lab 06/27/18 0901 06/28/18 0353 06/29/18 0520 06/30/18 0825 07/01/18 0507  NA 133* 136 135 135 132*  K 3.2* 3.8 3.5 3.6 3.7  CL 102 102 103 99 97*  CO2 22 26 26 27 26   GLUCOSE 76 94 149* 120* 104*  BUN 22 24* 32* 42* 41*  CREATININE 1.98* 1.99* 2.16* 2.39* 2.44*  CALCIUM 8.2* 8.2* 8.1* 8.4* 8.4*   GFR: Estimated Creatinine Clearance: 23.8 mL/min (A) (by C-G formula based on SCr of 2.44 mg/dL (H)). Liver Function Tests: Recent Labs  Lab 06/27/18 0901 06/28/18 0353 06/28/18 0446  AST 119* 77*  --   ALT 45* 35  --   ALKPHOS 82 74  --   BILITOT 0.6 0.6  --   PROT 8.1 7.3 7.3  ALBUMIN 2.3* 2.1*  --    No results for input(s): LIPASE, AMYLASE in the last 168 hours. No results for input(s): AMMONIA in the last 168 hours. Coagulation Profile: No results for input(s): INR, PROTIME in the last 168 hours. Cardiac Enzymes: No results for input(s): CKTOTAL, CKMB, CKMBINDEX, TROPONINI in the last 168 hours. BNP  (last 3 results) No results for input(s): PROBNP in the last 8760 hours. HbA1C: No results for input(s): HGBA1C in the last 72 hours. CBG: No results for input(s): GLUCAP in the last 168 hours. Lipid Profile: No results for input(s): CHOL, HDL, LDLCALC, TRIG, CHOLHDL, LDLDIRECT in the last 72 hours. Thyroid Function Tests: No results for input(s): TSH, T4TOTAL, FREET4, T3FREE, THYROIDAB in the last 72 hours. Anemia Panel: No results for input(s): VITAMINB12, FOLATE, FERRITIN, TIBC, IRON, RETICCTPCT in the last 72 hours.    Radiology Studies: I have reviewed all of the imaging during this hospital visit personally     Scheduled Meds: . amLODipine  10 mg Oral Daily  . azithromycin  500 mg Oral Daily  . feeding supplement (ENSURE ENLIVE)  237 mL Oral TID BM  . furosemide  40 mg Intravenous Q6H  . hydrALAZINE  50 mg Oral Q8H  . ipratropium-albuterol  3 mL Nebulization TID  . magnesium oxide  400 mg Oral Daily  . metoprolol succinate  100 mg Oral Daily  . multivitamin with minerals  1 tablet Oral Daily  . predniSONE  40 mg Oral Daily   Continuous Infusions: . sodium chloride 10 mL/hr at 07/01/18 1400  . cefTRIAXone (ROCEPHIN)  IV 1 g (07/01/18 1403)     LOS: 4 days        Mauricio Annett Gula, MD [facility] Pager [phone]

## 2018-07-02 ENCOUNTER — Inpatient Hospital Stay (HOSPITAL_COMMUNITY): Payer: BLUE CROSS/BLUE SHIELD

## 2018-07-02 ENCOUNTER — Encounter (HOSPITAL_COMMUNITY)
Admission: EM | Disposition: A | Discharge status: Home / Self Care | Payer: Self-pay | Source: Home / Self Care | Attending: Internal Medicine

## 2018-07-02 DIAGNOSIS — R918 Other nonspecific abnormal finding of lung field: Secondary | ICD-10-CM

## 2018-07-02 DIAGNOSIS — J440 Chronic obstructive pulmonary disease with acute lower respiratory infection: Secondary | ICD-10-CM

## 2018-07-02 DIAGNOSIS — J209 Acute bronchitis, unspecified: Secondary | ICD-10-CM

## 2018-07-02 HISTORY — PX: VIDEO BRONCHOSCOPY: SHX5072

## 2018-07-02 LAB — CBC WITH DIFFERENTIAL/PLATELET
Basophils Absolute: 0 10*3/uL (ref 0.0–0.1)
Basophils Relative: 0 %
EOS PCT: 0 %
Eosinophils Absolute: 0 10*3/uL (ref 0.0–0.7)
HEMATOCRIT: 33.8 % — AB (ref 39.0–52.0)
Hemoglobin: 10.6 g/dL — ABNORMAL LOW (ref 13.0–17.0)
Lymphocytes Relative: 27 %
Lymphs Abs: 1.9 10*3/uL (ref 0.7–4.0)
MCH: 26.5 pg (ref 26.0–34.0)
MCHC: 31.4 g/dL (ref 30.0–36.0)
MCV: 84.5 fL (ref 78.0–100.0)
MONOS PCT: 23 %
Monocytes Absolute: 1.6 10*3/uL — ABNORMAL HIGH (ref 0.1–1.0)
NEUTROS PCT: 50 %
Neutro Abs: 3.6 10*3/uL (ref 1.7–7.7)
PLATELETS: 295 10*3/uL (ref 150–400)
RBC: 4 MIL/uL — ABNORMAL LOW (ref 4.22–5.81)
RDW: 25.7 % — ABNORMAL HIGH (ref 11.5–15.5)
WBC: 7.1 10*3/uL (ref 4.0–10.5)

## 2018-07-02 LAB — BODY FLUID CULTURE: CULTURE: NO GROWTH

## 2018-07-02 LAB — BASIC METABOLIC PANEL
ANION GAP: 10 (ref 5–15)
BUN: 44 mg/dL — ABNORMAL HIGH (ref 8–23)
CO2: 24 mmol/L (ref 22–32)
Calcium: 8.5 mg/dL — ABNORMAL LOW (ref 8.9–10.3)
Chloride: 97 mmol/L — ABNORMAL LOW (ref 98–111)
Creatinine, Ser: 2.21 mg/dL — ABNORMAL HIGH (ref 0.61–1.24)
GFR calc Af Amer: 35 mL/min — ABNORMAL LOW (ref >60)
GFR calc non Af Amer: 30 mL/min — ABNORMAL LOW (ref >60)
GLUCOSE: 85 mg/dL (ref 70–99)
POTASSIUM: 4.3 mmol/L (ref 3.5–5.1)
Sodium: 131 mmol/L — ABNORMAL LOW (ref 135–145)

## 2018-07-02 LAB — RHEUMATOID FACTORS, FLUID: RHEUMATOID ARTHRITIS, QN/FLUID: NEGATIVE

## 2018-07-02 LAB — QUANTIFERON-TB GOLD PLUS (RQFGPL)
QUANTIFERON TB1 AG VALUE: 0.06 [IU]/mL
QuantiFERON Mitogen Value: 8.79 IU/mL
QuantiFERON Nil Value: 0.07 IU/mL
QuantiFERON TB2 Ag Value: 0.05 IU/mL

## 2018-07-02 LAB — QUANTIFERON-TB GOLD PLUS: QUANTIFERON-TB GOLD PLUS: NEGATIVE

## 2018-07-02 SURGERY — VIDEO BRONCHOSCOPY WITHOUT FLUORO
Anesthesia: Moderate Sedation | Laterality: Bilateral

## 2018-07-02 SURGERY — VIDEO BRONCHOSCOPY WITHOUT FLUORO
Anesthesia: Moderate Sedation

## 2018-07-02 MED ORDER — SODIUM CHLORIDE 0.9 % IV SOLN
Freq: Once | INTRAVENOUS | Status: AC
  Administered 2018-07-02: 08:00:00 via INTRAVENOUS

## 2018-07-02 MED ORDER — PHENYLEPHRINE HCL 0.25 % NA SOLN
1.0000 | Freq: Four times a day (QID) | NASAL | Status: DC | PRN
  Filled 2018-07-02: qty 15

## 2018-07-02 MED ORDER — FENTANYL CITRATE (PF) 100 MCG/2ML IJ SOLN
INTRAMUSCULAR | Status: DC | PRN
  Administered 2018-07-02: 25 ug via INTRAVENOUS
  Administered 2018-07-02: 50 ug via INTRAVENOUS

## 2018-07-02 MED ORDER — PHENYLEPHRINE HCL 0.25 % NA SOLN
NASAL | Status: DC | PRN
  Administered 2018-07-02: 2 via NASAL

## 2018-07-02 MED ORDER — FENTANYL CITRATE (PF) 100 MCG/2ML IJ SOLN
INTRAMUSCULAR | Status: AC
  Filled 2018-07-02: qty 4

## 2018-07-02 MED ORDER — MIDAZOLAM HCL 10 MG/2ML IJ SOLN
INTRAMUSCULAR | Status: DC | PRN
  Administered 2018-07-02: 2 mg via INTRAVENOUS
  Administered 2018-07-02: 1 mg via INTRAVENOUS

## 2018-07-02 MED ORDER — MIDAZOLAM HCL 5 MG/ML IJ SOLN
INTRAMUSCULAR | Status: AC
  Filled 2018-07-02: qty 2

## 2018-07-02 MED ORDER — LIDOCAINE HCL 2 % EX GEL
1.0000 "application " | Freq: Once | CUTANEOUS | Status: DC
  Filled 2018-07-02: qty 5

## 2018-07-02 MED ORDER — LIDOCAINE HCL URETHRAL/MUCOSAL 2 % EX GEL
CUTANEOUS | Status: DC | PRN
  Administered 2018-07-02: 1

## 2018-07-02 MED ORDER — BUTAMBEN-TETRACAINE-BENZOCAINE 2-2-14 % EX AERO: 1.0000 | INHALATION_SPRAY | Freq: Once | CUTANEOUS | Status: DC

## 2018-07-02 MED ORDER — LIDOCAINE HCL (PF) 1 % IJ SOLN
INTRAMUSCULAR | Status: DC | PRN
  Administered 2018-07-02: 6 mL

## 2018-07-02 NOTE — Plan of Care (Signed)
  Problem: Nutrition: Goal: Adequate nutrition will be maintained Outcome: Progressing   Problem: Pain Managment: Goal: General experience of comfort will improve Outcome: Progressing   

## 2018-07-02 NOTE — Op Note (Signed)
PCCM Video Bronchoscopy Procedure Note  The patient was informed of the risks (including but not limited to bleeding, infection, respiratory failure, lung injury, tooth/oral injury) and benefits of the procedure and gave consent, see chart.  Indication: Abnormal CT chest  Post Procedure Diagnosis: bronchomalacia left lower lobe  Location: [place] bronchoscopy suite  Condition pre procedure: stable  Medications for procedure: Versed 6m IV, Fentanyl 785m IV  Procedure description: The bronchoscope was introduced through the nose and passed to the bilateral lungs to the level of the subsegmental bronchi throughout the tracheobronchial tree.  Airway exam revealed normal appearing and functioning larynx.  The trachea was of normal caliber and the carina was sharp. The bilateral bronchial anatomy and mucosa was normal in appearance with the exception of some bronchomalacia in the left lower lobe partially occluding all airways, but easily traversed with the scope.    Procedures performed: BAL LUL anterior segment  Specimens sent: resp culture: bacterial, fungal, afb and cytology  Condition post procedure: stable  EBL: none from procedure  Complications: none immediate  BrRoselie AwkwardMD [place]CCM Pager: [phone]: [phone] 3pm or if no response, call [phone]

## 2018-07-02 NOTE — H&P (Signed)
[facility] PCCM  HPI: 62 y/o male presented with dyspnea, cough.  Noted to have upper lobe scarring and bilateral effusions.  Here for a bronchoscopy as there was concern for TB>  Past Medical History:  Diagnosis Date  . Anemia   . Atrial fibrillation (HCC)   . Chronic hepatitis C (HCC)   . CKD (chronic kidney disease) stage 3, GFR 30-59 ml/min (HCC)   . Hypertension   . Hyponatremia   . SDH (subdural hematoma) (HCC)      Family History  Problem Relation Age of Onset  . Cancer Mother   . Heart failure Father   . Diabetes Brother      Social History   Socioeconomic History  . Marital status: Single    Spouse name: Not on file  . Number of children: Not on file  . Years of education: Not on file  . Highest education level: Not on file  Occupational History  . Not on file  Social Needs  . Financial resource strain: Not on file  . Food insecurity:    Worry: Not on file    Inability: Not on file  . Transportation needs:    Medical: Not on file    Non-medical: Not on file  Tobacco Use  . Smoking status: Current Every Day Smoker    Packs/day: 1.00  . Smokeless tobacco: Never Used  Substance and Sexual Activity  . Alcohol use: Yes    Alcohol/week: 3.6 oz    Types: 6 Cans of beer per week    Comment: DAILY  . Drug use: Yes    Types: Cocaine  . Sexual activity: Not on file  Lifestyle  . Physical activity:    Days per week: Not on file    Minutes per session: Not on file  . Stress: Not on file  Relationships  . Social connections:    Talks on phone: Not on file    Gets together: Not on file    Attends religious service: Not on file    Active member of club or organization: Not on file    Attends meetings of clubs or organizations: Not on file    Relationship status: Not on file  . Intimate partner violence:    Fear of current or ex partner: Not on file    Emotionally abused: Not on file    Physically abused: Not on file    Forced sexual activity: Not on file  Other  Topics Concern  . Not on file  Social History Narrative  . Not on file     No Known Allergies   @[email or web address] @  Vitals:   07/02/18 0805 07/02/18 0810 07/02/18 0815 07/02/18 0820  BP: (!) 201/100 (!) 184/100 (!) 202/89 (!) 205/94  Pulse: (!) 101 (!) 101 (!) 101 (!) 102  Resp: (!) 24 (!) 22 (!) 26 (!) 25  Temp:      TempSrc:      SpO2: 100% 100% 100% 100%  Weight:      Height:       2L [place]  General:  Resting comfortably in bed HENT: NCAT OP clear PULM: CTA B, normal effort CV: RRR, no mgr GI: BS+, soft, nontender MSK: normal bulk and tone Neuro: awake, alert, no distress, MAEW  CBC    Component Value Date/Time   WBC 7.1 07/02/2018 0739   RBC 4.00 (L) 07/02/2018 0739   HGB 10.6 (L) 07/02/2018 0739   HCT 33.8 (L) 07/02/2018 0739   PLT 295 07/02/2018  0739   MCV 84.5 07/02/2018 0739   MCH 26.5 07/02/2018 0739   MCHC 31.4 07/02/2018 0739   RDW 25.7 (H) 07/02/2018 0739   LYMPHSABS PENDING 07/02/2018 0739   MONOABS PENDING 07/02/2018 0739   EOSABS PENDING 07/02/2018 0739   BASOSABS PENDING 07/02/2018 0739    Impression/plan: Abnormal CT chest > plan bronchoscopy today to rule out AFB organism  Heber Bancroft, MD [place] [facility] Pager: [phone] Cell: [phone] After 3pm or if no response, call [phone]

## 2018-07-02 NOTE — Progress Notes (Signed)
PROGRESS NOTE    Joseph Nash  IDP:[id number] DOB: [birth year] DOA: 06/27/2018 PCP: Kerin Perna, NP    Brief Narrative:  62 year old male who presented with dyspneaandcough.Patient does have the significant past medical history for hypertension, hepatitis C, tobacco and alcohol abuse, stage III chronic kidney disease, and paroxysmal atrial relation. History of subdural hematoma 2018. Recent hospitalization for symptomatic anemia, received 3 units PRBCs and IV iron. Post hospitalization patient had persistent dyspnea, and dry cough, which where worsening andassociated with pedal edema.Reported significant weight loss 30 pounds over the last 3 to 4 months. He was seen by his primary care provider, recommend him to come to the emergency department due to fluid overload. His initial physical examination blood pressure 202/118, heart rate 88, temperature 97.8, oxygen saturation 90%, he had moist mucous membranes, his lungs had decreased breath sounds bilaterally, no rhonchi, heart S1-S2 present, rhythmic, no gallops, rubs or murmurs, the abdomen soft nontender, positive pedal edema. Sodium 133, potassium 3.2, chloride 102, bicarb 22, glucose 76, BUN 22, creatinine 1.98, AST 119, ALT 45, total bilirubin 0.6, white count 6.0, hemoglobin 11.8, hematocrit 38.8, platelets 276.CT chest with bilateral pleural effusions with associated atelectasis, left lower lobe infiltrate. Bilateral upper lobe consolidative nodules.Chest radiograph with hyperinflation, positive bilateral pleural effusions more left than right, left lower lobe infiltrate. EKG sinus rhythm, left axis deviation, left ventricular hypertrophy,poor R wave progression, normal intervals.  Patient was admitted to the hospital working diagnosis of COPD exacerbation complicated by left lower lobe pneumonia,bilateral pleural effusions, cardiogenicpulmonary edema andbilateral apical pulmonary nodules,to rule out mycobacterial  infection and malignancy   Assessment & Plan:   Active Problems:   Hyponatremia   Chronic viral hepatitis C (HCC)   Severe anemia   CKD (chronic kidney disease)   Pleural effusion, bilateral   Lung mass   1. Left lower lobe pneumonia complicated with COPD exacerbation (present on admission).On IV ceftriaxone and po azithromycin. Continue oxymetry monitoring and supplemental -02 per [place]. Respondign well to bronchodilator therapy. Will discontinue systemic steroids. Will follow on results from bronchoscopy. For now will continue respiratory isolation until AFB negative.   2. Acute on chronic diastolic heart failure exacerbation with bilateraltransudatepleural effusions. Improve volume status, will hold on further diuresis for now. Continue B blockade with metoprolol, no ace inh due to AKI.  3.Pre renal AKI onCKD stage 3.Renal function has plateau at 2,4 to 2,2. Urine output documented 500 cc over last 24 hours. Will continue to follow renal panel in am, avoid hypotension or nephrotoxic medications. Today K is at 4,3 and serum bicarbonate at 24.   4, HTN.Continue blood pressure control withamlodipine, metoprolol and hydralazine.  5. Bilateral apical pulmonary nodules.Follow with BAL results from bronchoscopy .   6. Severe calorie protein malnutrition.  Continue nutritional supplements with ensure, magic cup and multivitamins.Physical therapy evaluation.   DVT prophylaxis:enoxaparin Code Status:full Family Communication:no family at the bedside Disposition Plan/ discharge barriers:pending BAL and AFB results.    Consultants:  Pulmonary  Procedures:    Antimicrobials   Subjective: Patient is feeling better, dyspnea has improved along with his cough, no nausea or vomiting, and tolerating po well.   Objective: Vitals:   07/02/18 0850 07/02/18 0855 07/02/18 0900 07/02/18 1342  BP: (!) 184/86 132/90 (!) 182/86 (!) 152/80  Pulse: (!) 102 99 98  99  Resp: _0 Temp:    97.6 F (36.4 C)  TempSrc:    Oral  SpO2: 100% 100% 100% 99%  Weight:  Height:        Intake/Output Summary (Last 24 hours) at 07/02/2018 1354 Last data filed at 07/02/2018 1300 Gross per 24 hour  Intake 1030 ml  Output 550 ml  Net 480 ml   Filed Weights   06/27/18 1519 07/02/18 0754  Weight: 54.4 kg (119 lb 14.9 oz) 54 kg (119 lb)    Examination:   General: Not in pain or dyspnea, deconditioned  Neurology: Awake and alert, non focal  E ENT: mild pallor, no icterus, oral mucosa moist Cardiovascular: Positive JVD. S1-S2 present, rhythmic, no gallops, rubs, or murmurs. No lower extremity edema. Pulmonary: positive breath sounds bilaterally, decreasesd air movement with prolonged expiratory phase, no wheezing, or rhonchi but scattered rales. Gastrointestinal. Abdomen with no organomegaly, non tender, no rebound or guarding Skin. No rashes Musculoskeletal: no joint deformities     Data Reviewed: I have personally reviewed following labs and imaging studies  CBC: Recent Labs  Lab 06/27/18 0901 06/29/18 0520 06/30/18 0825 07/02/18 0739  WBC 6.0 8.4 9.8 7.1  NEUTROABS  --  6.8 7.0 3.6  HGB 11.8* 10.7* 10.2* 10.6*  HCT 38.8* 33.9* 33.1* 33.8*  MCV 86.0 83.1 85.8 84.5  PLT 276 247 288 643   Basic Metabolic Panel: Recent Labs  Lab 06/28/18 0353 06/29/18 0520 06/30/18 0825 07/01/18 0507 07/02/18 0739  NA 136 135 135 132* 131*  K 3.8 3.5 3.6 3.7 4.3  CL 102 103 99 97* 97*  CO2 _0 GLUCOSE 94 149* 120* 104* 85  BUN 24* 32* 42* 41* 44*  CREATININE 1.99* 2.16* 2.39* 2.44* 2.21*  CALCIUM 8.2* 8.1* 8.4* 8.4* 8.5*   GFR: Estimated Creatinine Clearance: 26.1 mL/min (A) (by C-G formula based on SCr of 2.21 mg/dL (H)). Liver Function Tests: Recent Labs  Lab 06/27/18 0901 06/28/18 0353 06/28/18 0446  AST 119* 77*  --   ALT 45* 35  --   ALKPHOS 82 74  --   BILITOT 0.6 0.6  --   PROT 8.1 7.3 7.3  ALBUMIN 2.3* 2.1*   --    No results for input(s): LIPASE, AMYLASE in the last 168 hours. No results for input(s): AMMONIA in the last 168 hours. Coagulation Profile: No results for input(s): INR, PROTIME in the last 168 hours. Cardiac Enzymes: No results for input(s): CKTOTAL, CKMB, CKMBINDEX, TROPONINI in the last 168 hours. BNP (last 3 results) No results for input(s): PROBNP in the last 8760 hours. HbA1C: No results for input(s): HGBA1C in the last 72 hours. CBG: No results for input(s): GLUCAP in the last 168 hours. Lipid Profile: No results for input(s): CHOL, HDL, LDLCALC, TRIG, CHOLHDL, LDLDIRECT in the last 72 hours. Thyroid Function Tests: No results for input(s): TSH, T4TOTAL, FREET4, T3FREE, THYROIDAB in the last 72 hours. Anemia Panel: No results for input(s): VITAMINB12, FOLATE, FERRITIN, TIBC, IRON, RETICCTPCT in the last 72 hours.    Radiology Studies: I have reviewed all of the imaging during this hospital visit personally     Scheduled Meds: . amLODipine  10 mg Oral Daily  . azithromycin  500 mg Oral Daily  . feeding supplement (ENSURE ENLIVE)  237 mL Oral TID BM  . hydrALAZINE  50 mg Oral Q8H  . ipratropium-albuterol  3 mL Nebulization TID  . magnesium oxide  400 mg Oral Daily  . metoprolol succinate  100 mg Oral Daily  . multivitamin with minerals  1 tablet Oral Daily  . predniSONE  40 mg Oral Daily   Continuous  Infusions: . sodium chloride 10 mL/hr at 07/01/18 1400  . cefTRIAXone (ROCEPHIN)  IV 1 g (07/01/18 1403)     LOS: 5 days        Mauricio Gerome Apley, MD [facility] Pager [phone]

## 2018-07-02 NOTE — Progress Notes (Signed)
Video bronchoscopy performed.  Intervention bronchial washing.   No complications noted.  Will continue to monitor. 

## 2018-07-03 DIAGNOSIS — E871 Hypo-osmolality and hyponatremia: Secondary | ICD-10-CM

## 2018-07-03 DIAGNOSIS — I5031 Acute diastolic (congestive) heart failure: Secondary | ICD-10-CM

## 2018-07-03 LAB — BASIC METABOLIC PANEL
Anion gap: 8 (ref 5–15)
BUN: 51 mg/dL — ABNORMAL HIGH (ref 8–23)
CHLORIDE: 101 mmol/L (ref 98–111)
CO2: 27 mmol/L (ref 22–32)
CREATININE: 2.22 mg/dL — AB (ref 0.61–1.24)
Calcium: 8.8 mg/dL — ABNORMAL LOW (ref 8.9–10.3)
GFR calc Af Amer: 35 mL/min — ABNORMAL LOW (ref >60)
GFR calc non Af Amer: 30 mL/min — ABNORMAL LOW (ref >60)
Glucose, Bld: 101 mg/dL — ABNORMAL HIGH (ref 70–99)
Potassium: 4.3 mmol/L (ref 3.5–5.1)
Sodium: 136 mmol/L (ref 135–145)

## 2018-07-03 LAB — ACID FAST SMEAR (AFB)

## 2018-07-03 LAB — ADENOSIDE DEAMINASE, PLEURAL FL: ADENOSIDE DEAMINASE, PLEURAL FL: <1.6 U/L (ref 0.0–9.4)

## 2018-07-03 LAB — ACID FAST SMEAR (AFB, MYCOBACTERIA): Acid Fast Smear: NEGATIVE

## 2018-07-03 MED ORDER — AZITHROMYCIN 250 MG PO TABS: 250.0000 mg | ORAL_TABLET | Freq: Once | ORAL | 0 refills | Status: AC

## 2018-07-03 MED ORDER — AMLODIPINE BESYLATE 10 MG PO TABS: 10.0000 mg | ORAL_TABLET | Freq: Every day | ORAL | 0 refills | Status: AC

## 2018-07-03 MED ORDER — HYDRALAZINE HCL 50 MG PO TABS: 50.0000 mg | ORAL_TABLET | Freq: Three times a day (TID) | ORAL | 0 refills | Status: AC

## 2018-07-03 MED ORDER — ADULT MULTIVITAMIN W/MINERALS CH: 1.0000 | ORAL_TABLET | Freq: Every day | ORAL | Status: AC

## 2018-07-03 MED ORDER — IPRATROPIUM-ALBUTEROL 0.5-2.5 (3) MG/3ML IN SOLN: 3.0000 mL | Freq: Two times a day (BID) | RESPIRATORY_TRACT | Status: DC

## 2018-07-03 MED ORDER — ENSURE ENLIVE PO LIQD: 1.0000 | Freq: Three times a day (TID) | ORAL | 0 refills | Status: AC

## 2018-07-03 MED ORDER — CEFUROXIME AXETIL 250 MG PO TABS: 250.0000 mg | ORAL_TABLET | Freq: Two times a day (BID) | ORAL | 0 refills | Status: AC

## 2018-07-03 NOTE — Evaluation (Signed)
Physical Therapy Evaluation Patient Details Name: Joseph Nash MRN: [id number] DOB: 10/20/1956 Today's Date: 07/03/2018   History of Present Illness  62 year old male who presented with dyspnea and cough.  Patient does have the significant past medical history for hypertension, hepatitis C, tobacco and alcohol abuse, stage III chronic kidney disease, and paroxysmal atrial relation.  History of subdural hematoma 2018.  Recent hospitalization for symptomatic anemia, received 3 units PRBCs and IV iron.  Post hospitalization patient had persistent dyspnea, and dry cough, which where worsening and associated with pedal edema.  Reported significant weight loss 30 pounds over the last 3 to 4 months.  He was seen by his primary care provider, recommend him to come to the emergency department due to fluid overload.   Clinical Impression   Patient evaluated by Physical Therapy with no further acute PT needs identified. All education has been completed and the patient has no further questions. Managing independently, and O2 sats remained greater than or equal to 96% with activity on Room Air;  See below for any follow-up Physical Therapy or equipment needs. PT is signing off. Thank you for this referral.     Follow Up Recommendations No PT follow up    Equipment Recommendations  None recommended by PT    Recommendations for Other Services       Precautions / Restrictions Precautions Precautions: None      Mobility  Bed Mobility Overal bed mobility: Independent                Transfers Overall transfer level: Independent                  Ambulation/Gait Ambulation/Gait assistance: Independent Gait Distance (Feet): 50 Feet Assistive device: None Gait Pattern/deviations: WFL(Within Functional Limits)     General Gait Details: Manging independently with mobiltiy in hospital room  Stairs            Wheelchair Mobility    Modified Rankin (Stroke Patients Only)        Balance Overall balance assessment: No apparent balance deficits (not formally assessed)                                           Pertinent Vitals/Pain Pain Assessment: No/denies pain(Just annoying cough)    Home Living Family/patient expects to be discharged to:: Private residence Living Arrangements: Alone Available Help at Discharge: Friend(s);Available PRN/intermittently Type of Home: Apartment Home Access: Elevator     Home Layout: One level        Prior Function Level of Independence: Independent         Comments: Pays a friend to take him shopping or out     Hand Dominance        Extremity/Trunk Assessment   Upper Extremity Assessment Upper Extremity Assessment: Overall WFL for tasks assessed    Lower Extremity Assessment Lower Extremity Assessment: Overall WFL for tasks assessed       Communication   Communication: No difficulties  Cognition Arousal/Alertness: Awake/alert Behavior During Therapy: WFL for tasks assessed/performed Overall Cognitive Status: Within Functional Limits for tasks assessed                                        General Comments General comments (skin integrity, edema, etc.): Walked on Room  Air and O2 sats remained greater than or equal to 96%; RN notified    Exercises     Assessment/Plan    PT Assessment Patent does not need any further PT services  PT Problem List         PT Treatment Interventions      PT Goals (Current goals can be found in the Care Plan section)  Acute Rehab PT Goals Patient Stated Goal: Wants to get better; wants to know results of bronch PT Goal Formulation: All assessment and education complete, DC therapy    Frequency     Barriers to discharge        Co-evaluation               AM-PAC PT "6 Clicks" Daily Activity  Outcome Measure Difficulty turning over in bed (including adjusting bedclothes, sheets and blankets)?:  None Difficulty moving from lying on back to sitting on the side of the bed? : None Difficulty sitting down on and standing up from a chair with arms (e.g., wheelchair, bedside commode, etc,.)?: None Help needed moving to and from a bed to chair (including a wheelchair)?: None Help needed walking in hospital room?: None Help needed climbing 3-5 steps with a railing? : None 6 Click Score: 24    End of Session   Activity Tolerance: Patient tolerated treatment well Patient left: in chair;with call bell/phone within reach;Other (comment)(Dr. Mikhail in to see pt) Nurse Communication: Mobility status;Other (comment)(O2 sats on Room Air) PT Visit Diagnosis: Other abnormalities of gait and mobility (R26.89)    Time: 1610-9604 PT Time Calculation (min) (ACUTE ONLY): 21 min   Charges:   PT Evaluation $PT Eval Low Complexity: 1 Low     PT G Codes:        Van Clines, PT  Acute Rehabilitation Services Pager [phone] Office [phone]   Levi Aland 07/03/2018, 10:21 AM

## 2018-07-03 NOTE — Progress Notes (Signed)
Nutrition Follow-up  DOCUMENTATION CODES:   Underweight, Severe malnutrition in context of chronic illness  INTERVENTION:   -Continue Magic Cup TID with meals -Continue Ensure Enlive po TID, each supplement provides 350 kcal and 20 grams of protein -Continue MVI with minerals daily -Provided "Heart Failure Nutrition Therapy for the Undernourished" handout [facility]  NUTRITION DIAGNOSIS:   Severe Malnutrition related to chronic illness(chronic hepatitis/alcohol abuse) as evidenced by energy intake < or equal to 75% for > or equal to 1 month, severe fat depletion, severe muscle depletion.  Ongoing  GOAL:   Patient will meet greater than or equal to 90% of their needs  Progressing  MONITOR:   PO intake, Supplement acceptance, Weight trends, Labs  REASON FOR ASSESSMENT:   Malnutrition Screening Tool    ASSESSMENT:   Patient with PMH significant for uncontrolled HTN, hepatitis C, CKD III, PAF, and SDH 2018 after a fall. Recently admitted 6/30 for anemia. Presents this admission with complaints of dry cough. Admitted for bilateral pleural effusions, lung nodule, and wt loss.   7/19- s/p thoracentesis for lt large pleural effusion- 1600 ml fluid removed (sample given to pathology) 7/23- s/p bronchoscopy  Spoke with pt at bedside, who is in good spirits this morning. He reports his appetite "comes and goes"; waiting on toast this morning for breakfast. Documented meal completion 50-100%. Pt expresses frustration over meal options; RD reviewed menu with pt and assist pt with selecting meal order for lunch (RD personally entered meal order in meal delivery system).   Pt reports that he will continue Ensure supplements at home and has been consuming here. He is requesting an educational handout regarding his diet prior to discharging home, which this RD provided.   Labs reviewed.   Diet Order:   Diet Order           Diet Heart Room service appropriate?  Yes; Fluid consistency: Thin  Diet effective now          EDUCATION NEEDS:   Education needs have been addressed  Skin:  Skin Assessment: Reviewed RN Assessment  Last BM:  07/01/18  Height:   Ht Readings from Last 1 Encounters:  07/02/18 5\' 9"  (1.753 m)    Weight:   Wt Readings from Last 1 Encounters:  07/02/18 119 lb (54 kg)    Ideal Body Weight:  72.7 kg  BMI:  Body mass index is 17.57 kg/m.  Estimated Nutritional Needs:   Kcal:  1800-2000  Protein:  100-115 grams   Fluid:  >/= 1.8 L/day    Jakwan Sally A. Mayford KnifeWilliams, RD, LDN, CDE Pager: [phone] After hours Pager: [phone]

## 2018-07-03 NOTE — Discharge Summary (Signed)
Physician Discharge Summary  Joseph Nash JSH:[id number] DOB: 1956/08/07 DOA: 06/27/2018  PCP: Kerin Perna, NP  Admit date: 06/27/2018 Discharge date: 07/03/2018  Time spent: 45 minutes  Recommendations for Outpatient Follow-up:  Patient will be discharged to home.  Patient will need to follow up with primary care provider within one week of discharge, repeat BMP.  Follow up with pulmonology in 1 month. Patient should continue medications as prescribed.  Patient should follow a heart healthy diet.   Discharge Diagnoses:  Left lower lobe pneumonia complicated with possible COPD exacerbation Acute on chronic diastolic heart failure exacerbation with bilateral transudate pleural effusions cute kidney injury on chronic kidney disease, stage III Essential hypertension Bilateral apical pulmonary nodules Calorie protein malnutrition Deconditioning Hyponatremia  Discharge Condition: Stable  Diet recommendation: heart healthy  Filed Weights   06/27/18 1519 07/02/18 0754  Weight: 54.4 kg (119 lb 14.9 oz) 54 kg (119 lb)    History of present illness:  On 06/27/2018 by Dr. Guilford Shi Joseph Nash is a 62 y.o. male with past medical history h/o uncontrolled HTN, Hep C, tobacco no alcohol use, stage III CKD, PAF no longer on anticoagulation after fall with SDH 2018 who was recently admitted to this medical center for anemia work-up (presented on June 30 with hemoglobin of 4.1) as well as shortness of breath associated with weight loss.  Patient received 3 units of PRBC as well as IV iron.  His fecal occult blood test was negative and CT abdomen was negative for malignancy.  He was discharged with recommendations to avoid NSAIDs/aspirin and to follow-up outpatient GI for possible colonoscopy.  Hemoglobin at the last discharge improved to 9.9. Patient presented today to ED with worsening dyspnea. Patient states he had a dry cough during the last admission and after discharge it  progressively got worse.  He now has a wet cough but hardly able to produce phlegm.  Denies any hemoptysis.  He did lose about 30 pounds of weight in the last 3 to 4 months.  Patient states he saw his PCP for follow-up last week and yesterday he received a call from her advising him to go to the ER for "fluid around my heart".  He denies any hematemesis, melena, hematochezia, abdominal pain, chest pain or headaches.  Work-up in the emergency room including CT chest revealed bilateral large pleural effusions as well as bilateral upper lobe nodules raising suspicion for bronchogenic carcinoma.  Labs show a hemoglobin of 11.8, sodium 133, potassium 3.2 and abnormal LFTs.  He is requested to be admitted for further evaluation and management.  Hospital Course:  Left lower lobe pneumonia complicated with possible COPD exacerbation -Present on admission.  Per patient he has never been told he has COPD -Patient initially placed on ceftriaxone and azithromycin -Has been successfully weaned off of supplemental oxygen, currently maintaining oxygen saturations in the high 90s with ambulation -Continue bronchodilators -QuantiFERON gold appears to be unremarkable -Pulmonary consulted and appreciated status post bronchoscopy-cytopathology report showed no malignant cells, reactive mesothelial cells.  Recommended antibiotics for 7 days as well as outpatient pulmonary function test to evaluate for COPD, follow-up imaging in 1 month with PET scan.  Acute on chronic diastolic heart failure exacerbation with bilateral transudate pleural effusions -Volume status has improved -Echocardiogram showed EF of 55 to 85%, grade 2 diastolic dysfunction -Status post left thoracentesis, yielding 1600 mL's of fluid. Labs unremarkable -Diuretics have been held -Continue metoprolol -No ACE inhibitor due to acute kidney injury  Acute kidney  injury on chronic kidney disease, stage III -Creatinine currently 2.2.  Baseline  creatinine approximately 1.9-2.1 -Hold Lasix -Repeat BMP as an outpatient in 1 week  Essential hypertension -Controlled, continue amlodipine, metoprolol and hydralazine  Bilateral apical pulmonary nodules -Follow-up with pulmonology as an outpatient. -Discussed with Dr. Vaughan Browner, outpatient PET scan will be arranged   Calorie protein malnutrition -Continue feeding and nutritional supplementation as well as multivitamin  Deconditioning -PT consulted, no further therapy or recommendations needed  Hyponatremia -Resolved  Procedures: Echocardiogram Thoracentesis Bronchoscopy with BAL of left upper lobe  Consultations: Pulmonology   Discharge Exam: Vitals:   07/03/18 0604 07/03/18 0811  BP: (!) 166/86   Pulse:    Resp:    Temp:    SpO2:  100%   Patient states his breathing has improved.  He continues to have cough.  He denies chest pain, abdominal pain, nausea vomiting, diarrhea constipation, dizziness or headache.   General: Well developed, thin, NAD, appears stated age  20: NCAT, mucous membranes moist.  Neck: Supple  Cardiovascular: S1 S2 auscultated, no rubs, murmurs or gallops. Regular rate and rhythm.  Respiratory: Clear to auscultation bilaterally with equal chest rise  Abdomen: Soft, nontender, nondistended, + bowel sounds  Extremities: warm dry without cyanosis clubbing or edema  Neuro: AAOx3, nonfocal  Psych: Normal affect and demeanor with intact judgement and insight, pleasant  Discharge Instructions  Allergies as of 07/03/2018   No Known Allergies     Medication List    STOP taking these medications   losartan-hydrochlorothiazide 50-12.5 MG tablet Commonly known as:  HYZAAR     TAKE these medications   albuterol 108 (90 Base) MCG/ACT inhaler Commonly known as:  PROVENTIL HFA;VENTOLIN HFA Inhale 1 puff into the lungs every 6 (six) hours as needed for shortness of breath or wheezing.   amLODipine 10 MG tablet Commonly known as:   NORVASC Take 1 tablet (10 mg total) by mouth daily. Start taking on:  07/04/2018   azithromycin 250 MG tablet Commonly known as:  ZITHROMAX Take 1 tablet (250 mg total) by mouth once for 1 dose. Start taking on:  07/04/2018   benzonatate 100 MG capsule Commonly known as:  TESSALON Take 100 mg by mouth 3 (three) times daily as needed for cough.   cefUROXime 250 MG tablet Commonly known as:  CEFTIN Take 1 tablet (250 mg total) by mouth 2 (two) times daily for 1 day. Start taking on:  07/04/2018   feeding supplement (ENSURE ENLIVE) Liqd Take 237 mLs by mouth 3 (three) times daily between meals.   FEROSUL 325 (65 FE) MG tablet Generic drug:  ferrous sulfate Take 1 tablet (325 mg total) by mouth 3 (three) times daily. What changed:  when to take this   hydrALAZINE 50 MG tablet Commonly known as:  APRESOLINE Take 1 tablet (50 mg total) by mouth every 8 (eight) hours.   Magnesium Oxide 400 (240 Mg) MG Tabs Take 400 mg by mouth daily.   metoprolol succinate 100 MG 24 hr tablet Commonly known as:  TOPROL-XL Take 100 mg by mouth daily. Take with or immediately following a meal.   multivitamin with minerals Tabs tablet Take 1 tablet by mouth daily. Start taking on:  07/04/2018   pantoprazole 20 MG tablet Commonly known as:  PROTONIX Take 1 tablet (20 mg total) by mouth daily.      No Known Allergies Follow-up Information    Kerin Perna, NP. Schedule an appointment as soon as possible for a visit in  1 week(s).   Specialty:  Internal Medicine Why:  Hospital follow up Contact information: [place] [address] [phone]        Marshell Garfinkel, MD. Schedule an appointment as soon as possible for a visit in 3 week(s).   Specialty:  Pulmonary Disease Why:  Hospital follow up Contact information: [address] [place] [address] [phone]            The results of significant diagnostics from this hospitalization  (including imaging, microbiology, ancillary and laboratory) are listed below for reference.    Significant Diagnostic Studies: Ct Abdomen Pelvis Wo Contrast  Result Date: 06/09/2018 CLINICAL DATA:  Asymptomatic anemia. EXAM: CT ABDOMEN AND PELVIS WITHOUT CONTRAST TECHNIQUE: Multidetector CT imaging of the abdomen and pelvis was performed following the standard protocol without IV contrast. COMPARISON:  Renal ultrasound 05/24/2017 FINDINGS: Lower chest: Bilateral moderate-to-large pleural effusions. Bibasilar compressive atelectasis. Enlarged heart. Hepatobiliary: Normal nonenhanced appearance of the liver. Two gallstones within the partially decompressed gallbladder, the largest measures 8 mm. Pancreas: Unremarkable. No pancreatic ductal dilatation or surrounding inflammatory changes. Spleen: Normal in size without focal abnormality. Adrenals/Urinary Tract: Adrenal glands are unremarkable. Kidneys are without focal lesion, or hydronephrosis. Nonobstructive calculus in the upper pole the left kidney measures 4 mm. Bladder is unremarkable. Stomach/Bowel: Stomach is within normal limits. Appendix appears normal. No evidence of bowel wall thickening, distention, or inflammatory changes. Vascular/Lymphatic: Aortic atherosclerosis. No enlarged abdominal or pelvic lymph nodes. Reproductive: Prostate is unremarkable. Other: No abdominal wall hernia or abnormality. No abdominopelvic ascites. Musculoskeletal: Healed right 12 rib and L3 right lateral mass fractures. IMPRESSION: Bilateral moderate to large pleural effusions with dependent atelectasis of the bilateral lung bases. No acute abnormalities within the abdomen or pelvis. Advanced calcific atherosclerotic disease of the aorta. Cholelithiasis. Nonobstructive left nephrolithiasis. Electronically Signed   By: Fidela Salisbury M.D.   On: 06/09/2018 17:01   Dg Chest 2 View  Result Date: 06/27/2018 CLINICAL DATA:  Cough and shortness of breath EXAM: CHEST - 2  VIEW COMPARISON:  June 09, 2018 in May 24, 2017 FINDINGS: There is patchy airspace consolidation in the inferior lingula and portions of the left lower lobe with small left pleural effusion. There is a minimal right pleural effusion. There are areas of atelectatic change in the right mid lung and right base regions. There is scarring in each apex region, stable. There is a degree of underlying interstitial thickening, potentially a degree of chronic interstitial edema. Heart is upper normal in size with pulmonary vascularity normal. No evident adenopathy. No bone lesions. There are foci of coronary artery calcification. IMPRESSION: Small pleural effusions bilaterally, larger on the left than on the right. Consolidation involving portions of the inferior lingula and left lower lobe. Areas of atelectatic change on the right. Question chronic interstitial edema versus chronic inflammatory change leading to interstitial thickening. Heart remains upper normal in size. No evident adenopathy. There are foci of coronary artery calcification. Electronically Signed   By: Lowella Grip III M.D.   On: 06/27/2018 08:33   Dg Chest 2 View  Result Date: 06/09/2018 CLINICAL DATA:  Anemia. EXAM: CHEST - 2 VIEW COMPARISON:  May 24, 2017 FINDINGS: There is an effusion with underlying opacity in the left base which is worsened since 01/24/2017. Mild edema. Mild cardiomegaly. The hila and mediastinum are unremarkable. No pneumothorax. No other interval changes. IMPRESSION: 1. Mild pulmonary edema. 2. Focal infiltrate and effusion in the left base. Recommend follow-up to resolution.  Electronically Signed   By: Dorise Bullion III M.D   On: 06/09/2018 13:40   Ct Chest Wo Contrast  Addendum Date: 06/27/2018   ADDENDUM REPORT: 06/27/2018 15:38 ADDENDUM: Further clinical data provided. Upon PA interviewing patient, no reported pulmonary surgeries; therefore the linear densities at the lung apices are felt to represent  inflammatory calcifications. This would increase suspicion of pneumoconiosis/inflammatory process. Would also consider mycobacterium infection. Consider thoracentesis with cytology/microbiology if concern for malignancy. Findings conveyed to Sallye Ober, pulmonology PAon 06/27/2018 at15:37. Electronically Signed   By: Suzy Bouchard M.D.   On: 06/27/2018 15:38   Result Date: 06/27/2018 CLINICAL DATA:  C/o cough x 1 week  HC afib, HTN EXAM: CT CHEST WITHOUT CONTRAST TECHNIQUE: Multidetector CT imaging of the chest was performed following the standard protocol without IV contrast. COMPARISON:  None. FINDINGS: Cardiovascular: Coronary artery calcification and aortic atherosclerotic calcification. Mediastinum/Nodes: No axillary supraclavicular adenopathy Enlarged prevascular lymph node measures up to 2 cm short axis (image 64/3. Esophagus normal Lungs/Pleura: Surgical staples LEFT RIGHT lung apex. Along the staple line LEFT upper lobe there is nodular consolidation measuring 2.5 by 1.8 cm (image 30/4. In the RIGHT upper lobe more central peribronchial consolidation with air bronchograms measures 1.6 by 1.5 cm (image 50/4. There are large bilateral pleural effusions. There is atelectasis at the lung bases. Upper Abdomen: Limited view of the liver, kidneys, pancreas are unremarkable. Normal adrenal glands. Musculoskeletal: No aggressive osseous lesion IMPRESSION: 1. LEFT upper lobe and RIGHT upper lobe consolidative nodules with differential including bronchogenic carcinoma, pulmonary infection/inflammation, and pneumoconiosis. 2. Prior surgeries in the lung apices. 3. Large bilateral pleural effusions and mild interstitial edema in lower lobes are favored cardiogenic in nature 4. Consider bronchoscopy to evaluate the LEFT upper lobe lesion or outpatient FDG PET scan. Electronically Signed: By: Suzy Bouchard M.D. On: 06/27/2018 11:41   Dg Chest Port 1 View  Result Date: 06/28/2018 CLINICAL DATA:  Followup  pleural effusions. EXAM: PORTABLE CHEST 1 VIEW COMPARISON:  Chest radiographs and chest CT dated 06/27/2018. FINDINGS: The cardiac silhouette remains borderline enlarged. Mild increase in prominence of the interstitial markings in the lower lung zones with increased ill-defined opacity in the right hemithorax. Mild increase in amount of visible pleural fluid on the right and mild decrease in amount of visible pleural fluid on the left. There is no lateral view for comparing the posterior components. Diffuse osteopenia. Atheromatous arterial calcifications. IMPRESSION: 1. Mild increase in interstitial pulmonary edema superimposed on changes of COPD. 2. Probably increased pleural fluid on the right and decreased pleural fluid on the left. Electronically Signed   By: Claudie Revering M.D.   On: 06/28/2018 16:24    Microbiology: Recent Results (from the past 240 hour(s))  Body fluid culture     Status: None   Collection Time: 06/28/18  5:28 PM  Result Value Ref Range Status   Specimen Description FLUID LEFT PLEURAL  Final   Special Requests NONE  Final   Gram Stain   Final    FEW WBC PRESENT, PREDOMINANTLY MONONUCLEAR NO ORGANISMS SEEN    Culture   Final    NO GROWTH 3 DAYS Performed at [facility], [place] [address], [place], [place] [address]    Report Status 07/02/2018 FINAL  Final  Culture, respiratory (NON-Expectorated)     Status: None (Preliminary result)   Collection Time: 07/02/18  8:35 AM  Result Value Ref Range Status   Specimen Description BRONCHIAL ALVEOLAR LAVAGE  Final   Special  Requests NONE  Final   Gram Stain   Final    MODERATE WBC PRESENT, PREDOMINANTLY MONONUCLEAR RARE SQUAMOUS EPITHELIAL CELLS PRESENT RARE GRAM POSITIVE COCCI    Culture   Final    NO GROWTH 1 DAY Performed at [facility], [place] [address] [place], [place] [address]    Report Status PENDING  Incomplete  Acid Fast Smear (AFB)     Status: None   Collection Time: 07/02/18  8:35 AM  Result Value  Ref Range Status   AFB Specimen Processing Concentration  Final   Acid Fast Smear Negative  Final    Comment: (NOTE) Performed At: [facility] [place], [place] [id number] Rush Farmer MD FH:[id number]    Source (AFB) BRONCHIAL ALVEOLAR LAVAGE  Final     Labs: Basic Metabolic Panel: Recent Labs  Lab 06/29/18 0520 06/30/18 0825 07/01/18 0507 07/02/18 0739 07/03/18 0558  NA 135 135 132* 131* 136  K 3.5 3.6 3.7 4.3 4.3  CL 103 99 97* 97* 101  CO2 _0 GLUCOSE 149* 120* 104* 85 101*  BUN 32* 42* 41* 44* 51*  CREATININE 2.16* 2.39* 2.44* 2.21* 2.22*  CALCIUM 8.1* 8.4* 8.4* 8.5* 8.8*   Liver Function Tests: Recent Labs  Lab 06/27/18 0901 06/28/18 0353 06/28/18 0446  AST 119* 77*  --   ALT 45* 35  --   ALKPHOS 82 74  --   BILITOT 0.6 0.6  --   PROT 8.1 7.3 7.3  ALBUMIN 2.3* 2.1*  --    No results for input(s): LIPASE, AMYLASE in the last 168 hours. No results for input(s): AMMONIA in the last 168 hours. CBC: Recent Labs  Lab 06/27/18 0901 06/29/18 0520 06/30/18 0825 07/02/18 0739  WBC 6.0 8.4 9.8 7.1  NEUTROABS  --  6.8 7.0 3.6  HGB 11.8* 10.7* 10.2* 10.6*  HCT 38.8* 33.9* 33.1* 33.8*  MCV 86.0 83.1 85.8 84.5  PLT 276 247 288 295   Cardiac Enzymes: No results for input(s): CKTOTAL, CKMB, CKMBINDEX, TROPONINI in the last 168 hours. BNP: BNP (last 3 results) Recent Labs    06/27/18 0901  BNP >4,500.0*    ProBNP (last 3 results) No results for input(s): PROBNP in the last 8760 hours.  CBG: No results for input(s): GLUCAP in the last 168 hours.     Signed:  Cristal Ford  [facility] 07/03/2018, 12:42 PM

## 2018-07-03 NOTE — Progress Notes (Addendum)
Name: Joseph Nash MRN: [id number] DOB: 1956/03/14    ADMISSION DATE:  06/27/2018 CONSULTATION DATE:  06/27/18  REFERRING MD :  Dr. Earnest Conroy / [facility]   CHIEF COMPLAINT:  SOB, cough   HISTORY OF PRESENT ILLNESS:  62 y/o M, smoker (began in teens, smokes 1/2ppd typically, more if drinks more), who presented to [facility] ER on 7/18 with reports of cough and shortness of breath.    The patient is a former Biochemist, clinical at [employer].  He also drove a fork lift for approximately 12 years.  He carries a medical history of HTN, CKD III, Anemia, Paroxysmal Atrial Fibrillation (not on anticoagulation due to fall, anemia), Hepatitis C (not treated, HCV RNA 6.083), SDH after fall in the setting of bradycardia (05/2017) & ETOH use (drinks one 40oz beer in two days).  He was recently admitted from 6/30-06/10/18 after abnormal labs with his PCP.  He was found to be anemic with a Hgb of 4.1g/dl.  He improved with PRBC's.  CT abdomen evaluated and negative for occult malignancy.  He was recommended for outpatient follow up by [facility].    He returned to [facility] ER on 7/18 with reports of cough. He was seen by his PCP and told that he had "fluid around his heart" and instructed to come to the ER.  The patient reports black stools intermittently.  He has had shortness of breath with coughing episodes.  He reports minimal sputum production / clear to white.  He is uncertain if he has lost weight but thinks his clothes are fitting looser.  He has had swelling of his ankles, night sweats but no fevers / chills.  Denies incarceration, TB risk factors.  Denies hemoptysis. Denies prior lung surgeries.   Initial ER work up notable for Na 133, K 3.2, Sr Cr 1.98 (improved), AST 119, ALT 45, BNP >4,500, troponin 0.07, WBC 6, Hgb 11.8 and platelets 276.  CXR demonstrated bilateral pleural effusions L>R.  Follow up CT of the chest demonstrated LUL and RUL consolidative nodules, prior surgeries in the lung apices, large bilateral pleural  effusions, mild interstitial edema in lower lobes.    PCCM consulted for evaluation of abnormal CT findings.   SUBJECTIVE:  Underwent bronchoscopy yesterday with BAL of left upper lobe. Stable overnight.  Sitting in the chair, on room air Still has nonproductive cough.  No new complaints.  VITAL SIGNS: Temp:  [97.6 F (36.4 C)-98 F (36.7 C)] 97.9 F (36.6 C) (07/24 0536) Pulse Rate:  [99-100] 100 (07/24 0536) Resp:  [18] 18 (07/24 0536) BP: (152-166)/(72-86) 166/86 (07/24 0604) SpO2:  [99 %-100 %] 100 % (07/24 0811) FiO2 (%):  [2 %] 2 % (07/23 2100)  PHYSICAL EXAMINATION: Gen:      No acute distress HEENT:  EOMI, sclera anicteric Neck:     No masses; no thyromegaly Lungs:    Clear to auscultation bilaterally; normal respiratory effort CV:         Regular rate and rhythm; no murmurs Abd:      + bowel sounds; soft, non-tender; no palpable masses, no distension Ext:    No edema; adequate peripheral perfusion Skin:      Warm and dry; no rash Neuro: alert and oriented x 3 Psych: normal mood and affect   Recent Labs  Lab 07/01/18 0507 07/02/18 0739 07/03/18 0558  NA 132* 131* 136  K 3.7 4.3 4.3  CL 97* 97* 101  CO2 _0 BUN 41* 44* 51*  CREATININE 2.44* 2.21* 2.22*  GLUCOSE 104* 85 101*    Recent Labs  Lab 06/29/18 0520 06/30/18 0825 07/02/18 0739  HGB 10.7* 10.2* 10.6*  HCT 33.9* 33.1* 33.8*  WBC 8.4 9.8 7.1  PLT 247 288 295    No results found.   SIGNIFICANT EVENTS  7/18 Admit with cough, SOB x3 weeks  7/19- Thoracentesis of lt effusion 7/23- Bronch with BAL of LUL  STUDIES CT ABD/Pelvis 6/30 >> bilateral moderate to large pleural effusions with dependent atelectasis, no acute abnormalities within the abdomen or pelvis, cholelithiasis, non-obstructive left nephrolithiasis CT Chest 7/18 >> LUL and RUL consolidative nodules, large bilateral pleural effusions, mild interstitial edema in lower lobes I have reviewed the images  personally  CULTURES Pleural 7/19 >> negative BAL 7/24 >> AFB smear - negative  ANTIBIOTICS  Augmentin 7/18 >> 7/19 Ceftriaxone 7/19>  Azithromycin 7/19>  ASSESSMENT / PLAN: 62 year old with cough, dyspnea. He has abnormal CT scan showing upper lobe scarring, consolidative changes with calcification, nodular opacities, b/l effusion. CT is suggestive of old granulomatous infection with possible reactivation vs pneumonia superimposed on CHF.  AFB smear is negative.  Bronchoscope has bronchomalacia in the left lower lobe otherwise no significant findings Underwent thoracentesis on the left side showing transudative effusion, no malignancy on cytology.  Cough, resp failure, likely has COPD Community-acquired pneumonia Follow-up final bronch cultures OK to take off airborne precautions.  Continue antibiotics for total 7 days Duonebs Will need outpatient follow-up with PFTs to evaluate COPD, follow-up imaging of the chest in about 1 month with PET to make sure there is no underlying malignancy. We will make the arrangements.  Bilateral effusions, HFpEF HTN Continue norvasc, hydralazine, toprol Diuresis on hold due to elevated Cr.  PCCM will be available as needed during this admission.   Marshell Garfinkel MD [facility] 07/03/2018, 10:54 AM

## 2018-07-03 NOTE — Discharge Instructions (Addendum)
Community-Acquired Pneumonia, Adult  Pneumonia is an infection of the lungs. One type of pneumonia can happen while a person is in a hospital. A different type can happen when a person is not in a hospital (community-acquired pneumonia). It is easy for this kind to spread from person to person. It can spread to you if you breathe near an infected person who coughs or sneezes. Some symptoms include:  A dry cough.  A wet (productive) cough.  Fever.  Sweating.  Chest pain.  Follow these instructions at home:  Take over-the-counter and prescription medicines only as told by your doctor. ? Only take cough medicine if you are losing sleep. ? If you were prescribed an antibiotic medicine, take it as told by your doctor. Do not stop taking the antibiotic even if you start to feel better.  Sleep with your head and neck raised (elevated). You can do this by putting a few pillows under your head, or you can sleep in a recliner.  Do not use tobacco products. These include cigarettes, chewing tobacco, and e-cigarettes. If you need help quitting, ask your doctor.  Drink enough water to keep your pee (urine) clear or pale yellow. A shot (vaccine) can help prevent pneumonia. Shots are often suggested for:  People older than 64 years of age.  People older than 64 years of age: ? Who are having cancer treatment. ? Who have long-term (chronic) lung disease. ? Who have problems with their body's defense system (immune system).  You may also prevent pneumonia if you take these actions:  Get the flu (influenza) shot every year.  Go to the dentist as often as told.  Wash your hands often. If soap and water are not available, use hand sanitizer.  Contact a doctor if:  You have a fever.  You lose sleep because your cough medicine does not help. Get help right away if:  You are short of breath and it gets worse.  You have more chest pain.  Your sickness gets worse. This is very serious  if: ? You are an older adult. ? Your body's defense system is weak.  You cough up blood. This information is not intended to replace advice given to you by your health care provider. Make sure you discuss any questions you have with your health care provider. Document Released: 05/15/2008 Document Revised: 05/04/2016 Document Reviewed: 03/24/2015 Elsevier Interactive Patient Education  2018 Elsevier Inc.    Nutrition [facility] Proper nutrition can help your body recover from illness and injury.   Foods and beverages high in protein, vitamins, and minerals help rebuild muscle loss, promote healing, & reduce fall risk.   In addition to eating healthy foods, a nutrition shake is an easy, delicious way to get the nutrition you need during and after your hospital stay  It is recommended that you continue to drink 2 bottles per day of:       Ensure Plus for at least 64 days (30 days) after your hospital stay   Tips for adding a nutrition shake into your routine: As allowed, drink one with vitamins or medications instead of water or juice Enjoy one as a tasty mid-morning or afternoon snack Drink cold or make a milkshake out of it Drink one instead of milk with cereal or snacks Use as a coffee creamer   Available at the following grocery stores and pharmacies:           * [employer] * [employer] * [employer]  *  R[employer]          * Walmart * [employer]  * Wal[facility]reens      * Target  * [employer]J's   * [employer]S  * [employer]   * [facility] [phone]            For COUPONS visit: www.ensure.com/join or RoleLink.com.br   Suggested Substitutions Ensure Plus = Boost Plus = Carnation Breakfast Essentials = Boost Compact Ensure Active Clear = Boost Breeze Glucerna Shake = Boost Glucose Control = Carnation Breakfast Essentials SUGAR FREE

## 2018-07-04 ENCOUNTER — Other Ambulatory Visit: Payer: Self-pay

## 2018-07-04 ENCOUNTER — Encounter (HOSPITAL_COMMUNITY): Payer: Self-pay | Admitting: *Deleted

## 2018-07-04 ENCOUNTER — Emergency Department (HOSPITAL_COMMUNITY): Payer: BLUE CROSS/BLUE SHIELD

## 2018-07-04 ENCOUNTER — Emergency Department (HOSPITAL_COMMUNITY)
Admission: EM | Admit: 2018-07-04 | Discharge: 2018-07-04 | Disposition: A | Discharge status: Home / Self Care | Payer: BLUE CROSS/BLUE SHIELD | Attending: Emergency Medicine | Admitting: Emergency Medicine

## 2018-07-04 ENCOUNTER — Telehealth: Payer: Self-pay

## 2018-07-04 DIAGNOSIS — I130 Hypertensive heart and chronic kidney disease with heart failure and stage 1 through stage 4 chronic kidney disease, or unspecified chronic kidney disease: Secondary | ICD-10-CM | POA: Insufficient documentation

## 2018-07-04 DIAGNOSIS — R911 Solitary pulmonary nodule: Secondary | ICD-10-CM

## 2018-07-04 DIAGNOSIS — B182 Chronic viral hepatitis C: Secondary | ICD-10-CM | POA: Diagnosis not present

## 2018-07-04 DIAGNOSIS — R609 Edema, unspecified: Secondary | ICD-10-CM

## 2018-07-04 DIAGNOSIS — F17200 Nicotine dependence, unspecified, uncomplicated: Secondary | ICD-10-CM | POA: Diagnosis not present

## 2018-07-04 DIAGNOSIS — Z79899 Other long term (current) drug therapy: Secondary | ICD-10-CM | POA: Diagnosis not present

## 2018-07-04 DIAGNOSIS — R2243 Localized swelling, mass and lump, lower limb, bilateral: Secondary | ICD-10-CM | POA: Diagnosis present

## 2018-07-04 DIAGNOSIS — I5032 Chronic diastolic (congestive) heart failure: Secondary | ICD-10-CM | POA: Diagnosis not present

## 2018-07-04 DIAGNOSIS — N183 Chronic kidney disease, stage 3 (moderate): Secondary | ICD-10-CM | POA: Insufficient documentation

## 2018-07-04 DIAGNOSIS — R600 Localized edema: Secondary | ICD-10-CM | POA: Insufficient documentation

## 2018-07-04 LAB — CBC WITH DIFFERENTIAL/PLATELET
Basophils Absolute: 0.1 10*3/uL (ref 0.0–0.1)
Basophils Relative: 1 %
EOS PCT: 2 %
Eosinophils Absolute: 0.2 10*3/uL (ref 0.0–0.7)
HCT: 34.1 % — ABNORMAL LOW (ref 39.0–52.0)
Hemoglobin: 10.6 g/dL — ABNORMAL LOW (ref 13.0–17.0)
LYMPHS PCT: 17 %
Lymphs Abs: 1.6 10*3/uL (ref 0.7–4.0)
MCH: 26.8 pg (ref 26.0–34.0)
MCHC: 31.1 g/dL (ref 30.0–36.0)
MCV: 86.1 fL (ref 78.0–100.0)
Monocytes Absolute: 1.1 10*3/uL — ABNORMAL HIGH (ref 0.1–1.0)
Monocytes Relative: 12 %
NEUTROS PCT: 68 %
Neutro Abs: 6.4 10*3/uL (ref 1.7–7.7)
PLATELETS: 319 10*3/uL (ref 150–400)
RBC: 3.96 MIL/uL — AB (ref 4.22–5.81)
RDW: 25.9 % — ABNORMAL HIGH (ref 11.5–15.5)
WBC: 9.4 10*3/uL (ref 4.0–10.5)

## 2018-07-04 LAB — BASIC METABOLIC PANEL
ANION GAP: 10 (ref 5–15)
BUN: 54 mg/dL — ABNORMAL HIGH (ref 8–23)
CHLORIDE: 96 mmol/L — AB (ref 98–111)
CO2: 27 mmol/L (ref 22–32)
Calcium: 8.9 mg/dL (ref 8.9–10.3)
Creatinine, Ser: 2.51 mg/dL — ABNORMAL HIGH (ref 0.61–1.24)
GFR calc Af Amer: 30 mL/min — ABNORMAL LOW (ref >60)
GFR calc non Af Amer: 26 mL/min — ABNORMAL LOW (ref >60)
Glucose, Bld: 81 mg/dL (ref 70–99)
POTASSIUM: 4.3 mmol/L (ref 3.5–5.1)
Sodium: 133 mmol/L — ABNORMAL LOW (ref 135–145)

## 2018-07-04 LAB — CULTURE, RESPIRATORY: CULTURE: NORMAL

## 2018-07-04 LAB — BRAIN NATRIURETIC PEPTIDE: B Natriuretic Peptide: 685.7 pg/mL — ABNORMAL HIGH (ref 0.0–100.0)

## 2018-07-04 LAB — CULTURE, RESPIRATORY W GRAM STAIN

## 2018-07-04 NOTE — ED Notes (Signed)
Pt stated he was discharged and escorted to ED lobby.

## 2018-07-04 NOTE — ED Provider Notes (Signed)
[facility]ENC[facility] Provider Note   CSN: [id number] Arrival date & time: 07/04/18  [id number]     History   Chief Complaint Chief Complaint  Patient presents with  . Leg Swelling  . Groin Swelling    HPI Fuller MandrilRicardo F Macht is a 62 y.o. male.  The history is provided by the patient. No language interpreter was used.     62 year old male with history of CKD, chronic hepatitis C, atrial fibrillation not on any anticoagulant, anemia presenting complaining of leg swelling.  Patient was admitted to the hospital on 06/27/2018 and discharge yesterday for evaluation of shortness of breath.  He was found to have a left lower lobe pneumonia with possible COPD exacerbation as well as acute kidney injury and acute on chronic diastolic heart failure exacerbated with bilateral transudate pleural effusion.  Initially pleural effusion was thought to be due to malignancy.  Pulmonary consulted and patient undergone bronchoscopy and cytopathology which showed no malignant cells.  Patient placed on antibiotic.  He also had acute kidney injury which was treated with fluid.  Also has acute on chronic diastolic CHF, Echo showing 55-60% EF.  He was subsequently discharged home with prescription for albuterol, amlodipine, azithromycin, Tessalon, cefuroxime.  He has not had a chance to fill the medication yet.  Last night, when he went to sleep he noticed swelling to both lower extremities extending towards his groin which he felt is new.  He has never appreciate this much swelling before and decided to come to ER for further evaluation.  He denies any associated pain to his leg or back pain.  No worsening shortness of breath or productive cough, no fever chills and no chest pain.  Denies any abdominal pain.  Past Medical History:  Diagnosis Date  . Anemia   . Atrial fibrillation (HCC)   . Chronic hepatitis C (HCC)   . CKD (chronic kidney disease) stage 3, GFR 30-59 ml/min (HCC)   .  Hypertension   . Hyponatremia   . SDH (subdural hematoma) [facility](HCC)     Patient Active Problem List   Diagnosis Date Noted  . Lung mass   . Pleural effusion, bilateral 06/27/2018  . Protein-calorie malnutrition, severe 06/10/2018  . Severe anemia 06/09/2018  . CKD (chronic kidney disease) 06/09/2018  . Chronic viral hepatitis C (HCC) 05/25/2017  . Paroxysmal atrial fibrillation (HCC) 05/24/2017  . Acute kidney injury (HCC) 05/24/2017  . Hyponatremia 05/24/2017  . Hypertension 05/24/2017  . Intracranial hematoma (HCC) 05/24/2017  . Hematoma 05/24/2017    Past Surgical History:  Procedure Laterality Date  . NO PAST SURGERIES          Home Medications    Prior to Admission medications   Medication Sig Start Date End Date Taking? Authorizing Provider  albuterol (PROVENTIL HFA;VENTOLIN HFA) 108 (90 Base) MCG/ACT inhaler Inhale 1 puff into the lungs every 6 (six) hours as needed for shortness of breath or wheezing. 06/07/18   [provider]  amLODipine (NORVASC) 10 MG tablet Take 1 tablet (10 mg total) by mouth daily. 07/04/18   Mikhail, Nita SellsMaryann, DO  azithromycin (ZITHROMAX) 250 MG tablet Take 1 tablet (250 mg total) by mouth once for 1 dose. 07/04/18 07/04/18  Edsel PetrinMikhail, Maryann, DO  benzonatate (TESSALON) 100 MG capsule Take 100 mg by mouth 3 (three) times daily as needed for cough.  06/24/18   [provider]  cefUROXime (CEFTIN) 250 MG tablet Take 1 tablet (250 mg total) by mouth 2 (two) times daily  for 1 day. 07/04/18 07/05/18  Edsel Petrin, DO  feeding supplement, ENSURE ENLIVE, (ENSURE ENLIVE) LIQD Take 237 mLs by mouth 3 (three) times daily between meals. 07/03/18   Mikhail, Nita Sells, DO  FEROSUL 325 (65 Fe) MG tablet Take 1 tablet (325 mg total) by mouth 3 (three) times daily. Patient taking differently: Take 325 mg by mouth daily.  06/10/18   Tyrone Nine, MD  hydrALAZINE (APRESOLINE) 50 MG tablet Take 1 tablet (50 mg total) by mouth every 8 (eight) hours. 07/03/18    Edsel Petrin, DO  Magnesium Oxide 400 (240 Mg) MG TABS Take 400 mg by mouth daily.  04/01/18   [provider]  metoprolol succinate (TOPROL-XL) 100 MG 24 hr tablet Take 100 mg by mouth daily. Take with or immediately following a meal.    [provider]  Multiple Vitamin (MULTIVITAMIN WITH MINERALS) TABS tablet Take 1 tablet by mouth daily. 07/04/18   Mikhail, Nita Sells, DO  pantoprazole (PROTONIX) 20 MG tablet Take 1 tablet (20 mg total) by mouth daily. Patient not taking: Reported on 06/27/2018 06/10/18   Tyrone Nine, MD    Family History Family History  Problem Relation Age of Onset  . Cancer Mother   . Heart failure Father   . Diabetes Brother     Social History Social History   Tobacco Use  . Smoking status: Current Every Day Smoker    Packs/day: 1.00  . Smokeless tobacco: Never Used  Substance Use Topics  . Alcohol use: Yes    Alcohol/week: 3.6 oz    Types: 6 Cans of beer per week    Comment: DAILY  . Drug use: Yes    Types: Cocaine     Allergies   Patient has no known allergies.   Review of Systems Review of Systems  All other systems reviewed and are negative.    Physical Exam Updated Vital Signs BP (!) 145/68 (BP Location: Right Arm)   Pulse 97   Temp 98 F (36.7 C) (Oral)   Resp 20   SpO2 100%   Physical Exam  Constitutional: He appears well-developed and well-nourished. No distress.  Chronically ill-appearing male in no acute discomfort.  HENT:  Head: Atraumatic.  Eyes: Conjunctivae are normal.  Neck: Neck supple. JVD present.  Cardiovascular: Intact distal pulses.  Irregular heart rhythm without murmur rubs or gallop  Pulmonary/Chest: Effort normal.  Decreased lung sounds at lower base with mild rales  Abdominal: Soft. Bowel sounds are normal. He exhibits no distension. There is no tenderness.  Musculoskeletal: He exhibits edema (2+ pitting edema to bilateral lower extremities extending towards thigh.  Without any calf  tenderness or erythema.  Dry skin noted throughout.).  Neurological: He is alert.  Skin: No rash noted. No erythema.  Psychiatric: He has a normal mood and affect.  Nursing note and vitals reviewed.    ED Treatments / Results  Labs (all labs ordered are listed, but only abnormal results are displayed) Labs Reviewed  BASIC METABOLIC PANEL - Abnormal; Notable for the following components:      Result Value   Sodium 133 (*)    Chloride 96 (*)    BUN 54 (*)    Creatinine, Ser 2.51 (*)    GFR calc non Af Amer 26 (*)    GFR calc Af Amer 30 (*)    All other components within normal limits  BRAIN NATRIURETIC PEPTIDE - Abnormal; Notable for the following components:   B Natriuretic Peptide 685.7 (*)  All other components within normal limits  CBC WITH DIFFERENTIAL/PLATELET - Abnormal; Notable for the following components:   RBC 3.96 (*)    Hemoglobin 10.6 (*)    HCT 34.1 (*)    RDW 25.9 (*)    Monocytes Absolute 1.1 (*)    All other components within normal limits    EKG None  Radiology Dg Chest 2 View  Result Date: 07/04/2018 CLINICAL DATA:  Bilateral lower extremity swelling EXAM: CHEST - 2 VIEW COMPARISON:  06/28/2018 FINDINGS: Cardiac shadow is stable. Mild vascular congestion is again seen but significantly improved when compared with the prior exam. Apical changes are again noted on the left stable from previous exams. Small bilateral pleural effusions are noted left greater than right. IMPRESSION: Some improved aeration is noted when compare with the prior study. Persistent bilateral effusions and interstitial changes are noted as well as the focal density in the left upper lobe. Electronically Signed   By: Alcide Clever M.D.   On: 07/04/2018 12:15    Procedures Procedures (including critical care time)  Medications Ordered in ED Medications - No data to display   Initial Impression / Assessment and Plan / ED Course  I have reviewed the triage vital signs and the  nursing notes.  Pertinent labs & imaging results that were available during my care of the patient were reviewed by me and considered in my medical decision making (see chart for details).     BP (!) 145/68 (BP Location: Right Arm)   Pulse 97   Temp 98 F (36.7 C) (Oral)   Resp 20   Ht 5\' 9"  (1.753 m)   Wt 54 kg (119 lb)   SpO2 100%   BMI 17.57 kg/m    Final Clinical Impressions(s) / ED Diagnoses   Final diagnoses:  Peripheral edema    ED Discharge Orders    None     10:03 AM Patient recently discharge from hospital with diagnosis of pneumonia, acute on chronic CHF exacerbation as well as renal insufficiency.  He returns today because he noticed swelling to bilateral lower extremities without any pain.  Symptoms not suggestive of acute DVT involving both lower extremities.  No evidence to suggest cellulitis or other infection.  He has not had a chance to refill his medication recently prescribed.  He does appear to be fluid overloaded with JVD, rales on lung base, and peripheral edema.  1:46 PM Evidence of chronic kidney disease with BUN 54, creatinine 2.51 similar to prior.  His GFR is 30.  BNP is 685 which is a much improvement from recent value while he was hospitalized.  Mild anemia with hemoglobin of 10.6.  Chest x-ray today shows some improved variation compared to prior study.  Persistent bilateral effusion interstitial change were noted as well as focal density in the left lower lobe.  At this time, recommend patient to wear compressive stocking, keep legs elevated, and to start taking medication prescribed to him recently.  Patient will need to follow-up closely with primary care provider for further follow-up.  Return precautions discussed.   Fayrene Helper, PA-C 07/04/18 1405    Tilden Fossa, MD 07/10/18 1620

## 2018-07-04 NOTE — Telephone Encounter (Signed)
Pt has been scheduled for HFU on 07/26/18 with Dr. Isaiah SergeMannam. PET scan has been ordered. Nothing further is needed.

## 2018-07-04 NOTE — ED Triage Notes (Signed)
Pt in c/o groin, upper leg and knee swelling bilaterally since last night, seen here yesterday for breathing concern, no distress noted

## 2018-07-04 NOTE — Discharge Instructions (Signed)
Please wear compression stocking to help with swelling.  Keep legs elevated and take your medications as prescribed.  Follow up with your provider.

## 2018-07-04 NOTE — Telephone Encounter (Signed)
-----   Message from Chilton GreathousePraveen Mannam, MD sent at 07/03/2018 11:01 AM EDT ----- Regarding: Hosptial follow up Mr Hadley PenCuttino was seen in hospital. He will need PET scan with follow up in clinic in 1 month Thanks

## 2018-07-12 ENCOUNTER — Encounter (HOSPITAL_COMMUNITY): Payer: BLUE CROSS/BLUE SHIELD

## 2018-07-18 ENCOUNTER — Ambulatory Visit (HOSPITAL_COMMUNITY)
Admission: RE | Admit: 2018-07-18 | Discharge: 2018-07-18 | Disposition: A | Discharge status: Home / Self Care | Payer: BLUE CROSS/BLUE SHIELD | Source: Ambulatory Visit | Attending: Pulmonary Disease | Admitting: Pulmonary Disease

## 2018-07-18 ENCOUNTER — Telehealth: Payer: Self-pay | Admitting: Pulmonary Disease

## 2018-07-18 DIAGNOSIS — R911 Solitary pulmonary nodule: Secondary | ICD-10-CM | POA: Diagnosis not present

## 2018-07-18 DIAGNOSIS — R918 Other nonspecific abnormal finding of lung field: Secondary | ICD-10-CM | POA: Insufficient documentation

## 2018-07-18 LAB — GLUCOSE, CAPILLARY: GLUCOSE-CAPILLARY: 97 mg/dL (ref 70–99)

## 2018-07-18 MED ORDER — FLUDEOXYGLUCOSE F - 18 (FDG) INJECTION
5.8900 | Freq: Once | INTRAVENOUS | Status: AC | PRN
  Administered 2018-07-18: 5.89 via INTRAVENOUS

## 2018-07-18 NOTE — Telephone Encounter (Signed)
[facility] Please see IMPRESSION copied below:  IMPRESSION: 1. Mild metabolic activity associated with biapical consolidation with central calcifications is favored benign post inflammatory or infectious process. 2. Moderate activity associated with sub solid mass in the RIGHT middle lobe and RIGHT upper nodule is favored a benign post infectious or inflammatory process. This finding is new from CT 06/27/2018 which favors a post infectious or inflammatory process. 3. Moderate metabolic activity associated with subcarinal adenopathy is favored reactive. 4. Recommend clinical correlation for progressive infectious / inflammatory pulmonary process.  Routing to Dr Isaiah SergeMannam as Lorain ChildesFYI

## 2018-07-22 NOTE — Telephone Encounter (Signed)
Pt aware of results/recs.  Verified OV with pt.  Nothing further needed.

## 2018-07-22 NOTE — Telephone Encounter (Signed)
Please let pt know PET scan shows findings c/w inflammation. Will discuss in detail with patient at clinic visit on 8/16

## 2018-07-26 ENCOUNTER — Encounter: Payer: Self-pay | Admitting: Pulmonary Disease

## 2018-07-26 ENCOUNTER — Other Ambulatory Visit (INDEPENDENT_AMBULATORY_CARE_PROVIDER_SITE_OTHER): Payer: BLUE CROSS/BLUE SHIELD

## 2018-07-26 ENCOUNTER — Ambulatory Visit: Payer: BLUE CROSS/BLUE SHIELD | Admitting: Pulmonary Disease

## 2018-07-26 VITALS — BP 130/68 | HR 65 | Ht 69.5 in | Wt 127.0 lb

## 2018-07-26 DIAGNOSIS — R0602 Shortness of breath: Secondary | ICD-10-CM

## 2018-07-26 DIAGNOSIS — R918 Other nonspecific abnormal finding of lung field: Secondary | ICD-10-CM | POA: Diagnosis not present

## 2018-07-26 DIAGNOSIS — I509 Heart failure, unspecified: Secondary | ICD-10-CM

## 2018-07-26 DIAGNOSIS — I480 Paroxysmal atrial fibrillation: Secondary | ICD-10-CM | POA: Diagnosis not present

## 2018-07-26 LAB — CBC WITH DIFFERENTIAL/PLATELET
Basophils Relative: 2 % (ref 0.0–3.0)
Eosinophils Relative: 6 % — ABNORMAL HIGH (ref 0.0–5.0)
HCT: 31.4 % — ABNORMAL LOW (ref 39.0–52.0)
Hemoglobin: 10.4 g/dL — ABNORMAL LOW (ref 13.0–17.0)
LYMPHS PCT: 25 % (ref 12.0–46.0)
MCHC: 33.2 g/dL (ref 30.0–36.0)
MCV: 86.1 fl (ref 78.0–100.0)
MONOS PCT: 11 % (ref 3.0–12.0)
NEUTROS PCT: 56 % (ref 43.0–77.0)
Platelets: 394 10*3/uL (ref 150.0–400.0)
RBC: 3.65 Mil/uL — AB (ref 4.22–5.81)
RDW: 24.8 % — AB (ref 11.5–15.5)
WBC: 4.7 10*3/uL (ref 4.0–10.5)

## 2018-07-26 LAB — COMPREHENSIVE METABOLIC PANEL
ALT: 22 U/L (ref 0–53)
AST: 67 U/L — AB (ref 0–37)
Albumin: 2.6 g/dL — ABNORMAL LOW (ref 3.5–5.2)
Alkaline Phosphatase: 117 U/L (ref 39–117)
BILIRUBIN TOTAL: 0.4 mg/dL (ref 0.2–1.2)
BUN: 13 mg/dL (ref 6–23)
CALCIUM: 8.4 mg/dL (ref 8.4–10.5)
CO2: 25 meq/L (ref 19–32)
CREATININE: 2.01 mg/dL — AB (ref 0.40–1.50)
Chloride: 105 mEq/L (ref 96–112)
GFR: 43.24 mL/min — AB (ref >60.00)
Glucose, Bld: 94 mg/dL (ref 70–99)
Potassium: 3.3 mEq/L — ABNORMAL LOW (ref 3.5–5.1)
Sodium: 136 mEq/L (ref 135–145)
Total Protein: 8 g/dL (ref 6.0–8.3)

## 2018-07-26 LAB — BRAIN NATRIURETIC PEPTIDE: Pro B Natriuretic peptide (BNP): 728 pg/mL — ABNORMAL HIGH (ref 0.0–100.0)

## 2018-07-26 MED ORDER — FUROSEMIDE 40 MG PO TABS: 40.0000 mg | ORAL_TABLET | Freq: Every day | ORAL | 1 refills | Status: AC

## 2018-07-26 MED ORDER — UMECLIDINIUM-VILANTEROL 62.5-25 MCG/INH IN AEPB: 1.0000 | INHALATION_SPRAY | Freq: Every day | RESPIRATORY_TRACT | 5 refills | Status: DC

## 2018-07-26 MED ORDER — UMECLIDINIUM-VILANTEROL 62.5-25 MCG/INH IN AEPB: 1.0000 | INHALATION_SPRAY | Freq: Every day | RESPIRATORY_TRACT | 5 refills | Status: AC

## 2018-07-26 NOTE — Progress Notes (Addendum)
Joseph Nash    [id number]    [birth year]  Primary Care Physician:Edwards, Milford Cage, NP  Referring Physician: Kerin Perna, NP [address] [place], [place] [address]  Chief complaint: Follow-up after hospitalization for respiratory failure  HPI: 62 year old with history of hepatitis C, hypertension, atrial fibrillation, chronic kidney disease, subdural hematoma, smoker, daily alcohol use.    Admitted to [facility] on 7/18 with weight loss, failure to thrive, cough, night sweats, dyspnea. Previous admission for anemia.  He had a CT scan at that time which showed bilateral effusions left greater than right with upper lobe nodular consolidation and PCCM consulted for help with management.  Underwent a thoracentesis showed transudative effusion.  Bronchoscopy was unrevealing with negative cultures and cytology.  After discharge he was seen again in the emergency room on 7/25 with bilateral lower extremity edema thought to be secondary to heart failure.  He also had a follow-up PET scan which showed mild uptake in the upper lobe nodular opacities which is suggestive of inflammatory process.  Returns to clinic today for outpatient follow-up.  States that still has cough with white mucus, denies any fevers, chills Has significant dyspnea on exertion, lower extremity edema  Pets: No pets Occupation: Former Biochemist, clinical at [employer], drove forklift for 12 years. Exposures: No known exposures, no mold, hot tub, Jacuzzi Smoking history: 40-pack-year smoker.  Quit smoking in June 2019 Travel history: No significant travel history Relevant family history: His brothers had pulmonary fibrosis of unspecified etiology.  Outpatient Encounter Medications as of 07/26/2018  Medication Sig  . albuterol (PROVENTIL HFA;VENTOLIN HFA) 108 (90 Base) MCG/ACT inhaler Inhale 1 puff into the lungs every 6 (six) hours as needed for shortness of breath or wheezing.  Marland Kitchen amLODipine  (NORVASC) 10 MG tablet Take 1 tablet (10 mg total) by mouth daily.  . benzonatate (TESSALON) 100 MG capsule Take 100 mg by mouth 3 (three) times daily as needed for cough.   . feeding supplement, ENSURE ENLIVE, (ENSURE ENLIVE) LIQD Take 237 mLs by mouth 3 (three) times daily between meals.  . FEROSUL 325 (65 Fe) MG tablet Take 1 tablet (325 mg total) by mouth 3 (three) times daily.  . hydrALAZINE (APRESOLINE) 50 MG tablet Take 1 tablet (50 mg total) by mouth every 8 (eight) hours.  . Magnesium Oxide 400 (240 Mg) MG TABS Take 400 mg by mouth daily.   . metoprolol succinate (TOPROL-XL) 100 MG 24 hr tablet Take 100 mg by mouth daily. Take with or immediately following a meal.  . pantoprazole (PROTONIX) 20 MG tablet Take 1 tablet (20 mg total) by mouth daily.  . Multiple Vitamin (MULTIVITAMIN WITH MINERALS) TABS tablet Take 1 tablet by mouth daily. (Patient not taking: Reported on 07/26/2018)   No facility-administered encounter medications on file as of 07/26/2018.     Allergies as of 07/26/2018  . (No Known Allergies)    Past Medical History:  Diagnosis Date  . Anemia   . Atrial fibrillation ([place])   . Chronic hepatitis C ([place])   . CKD (chronic kidney disease) stage 3, GFR 30-59 ml/min (H[facility])   . Hypertension   . Hyponatremia   . SDH (subdural hematoma) ([facility])     Past Surgical History:  Procedure Laterality Date  . NO PAST SURGERIES    . VIDEO BRONCHOSCOPY Bilateral 07/02/2018   Procedure: VIDEO BRONCHOSCOPY WITHOUT FLUORO;  Surgeon: Juanito Doom, MD;  Location: [facility] ENDOSCOPY;  Service: Cardiopulmonary;  Laterality: Bilateral;    Family History  Problem Relation Age of Onset  . Cancer Mother   . Heart failure Father   . Diabetes Brother     Social History   Socioeconomic History  . Marital status: Single    Spouse name: Not on file  . Number of children: Not on file  . Years of education: Not on file  . Highest education level: Not on file  Occupational History  . Not  on file  Social Needs  . Financial resource strain: Not on file  . Food insecurity:    Worry: Not on file    Inability: Not on file  . Transportation needs:    Medical: Not on file    Non-medical: Not on file  Tobacco Use  . Smoking status: Former Smoker    Packs/day: 1.00    Years: 40.00    Pack years: 40.00    Types: Cigarettes    Last attempt to quit: 06/25/2018    Years since quitting: 0.0  . Smokeless tobacco: Never Used  Substance and Sexual Activity  . Alcohol use: Yes    Alcohol/week: 6.0 standard drinks    Types: 6 Cans of beer per week    Comment: DAILY  . Drug use: Yes    Types: Cocaine  . Sexual activity: Not on file  Lifestyle  . Physical activity:    Days per week: Not on file    Minutes per session: Not on file  . Stress: Not on file  Relationships  . Social connections:    Talks on phone: Not on file    Gets together: Not on file    Attends religious service: Not on file    Active member of club or organization: Not on file    Attends meetings of clubs or organizations: Not on file    Relationship status: Not on file  . Intimate partner violence:    Fear of current or ex partner: Not on file    Emotionally abused: Not on file    Physically abused: Not on file    Forced sexual activity: Not on file  Other Topics Concern  . Not on file  Social History Narrative  . Not on file    Review of systems: Review of Systems  Constitutional: Negative for fever and chills.  HENT: Negative.   Eyes: Negative for blurred vision.  Respiratory: as per HPI  Cardiovascular: Negative for chest pain and palpitations.  Gastrointestinal: Negative for vomiting, diarrhea, blood per rectum. Genitourinary: Negative for dysuria, urgency, frequency and hematuria.  Musculoskeletal: Negative for myalgias, back pain and joint pain.  Skin: Negative for itching and rash.  Neurological: Negative for dizziness, tremors, focal weakness, seizures and loss of consciousness.    Endo/Heme/Allergies: Negative for environmental allergies.  Psychiatric/Behavioral: Negative for depression, suicidal ideas and hallucinations.  All other systems reviewed and are negative.  Physical Exam: Blood pressure 130/68, pulse 65, height 5' 9.5" (1.765 m), weight 127 lb (57.6 kg), SpO2 95 %. Gen:      No acute distress HEENT:  EOMI, sclera anicteric Neck:     No masses; no thyromegaly Lungs:    Good crackles.  No wheeze CV:         Tachycardia cardia, irregular.  No murmurs. Abd:      + bowel sounds; soft, non-tender; no palpable masses, no distension Ext:    1-2+ edema; adequate peripheral perfusion Skin:  Warm and dry; no rash Neuro: alert and oriented x 3 Psych: normal mood and affect  Data Reviewed: Imaging CT ABD/Pelvis 06/09/18- bilateral moderate to large pleural effusions with dependent atelectasis, no acute abnormalities within the abdomen or pelvis, cholelithiasis, non-obstructive left nephrolithiasis  CT Chest 06/27/18- LUL and RUL consolidative nodules, large bilateral pleural effusions, mild interstitial edema in lower lobes  PET scan 06/13/2594-GLOV hypermetabolic activity with biapical consolidation, central calcification.  Moderate activity in the sub-solid mass in the right middle lobe and right upper lobe.  Consistent with benign postinfectious or inflammatory process.  Moderate subcarinal adenopathy. I have reviewed the images personally.  Cardiac Echo 06/28/2018 Moderate LVH, EF 56-43%, grade 2 diastolic dysfunction with elevated ventricular end-diastolic filling pressure.  Six   EKG 07/19/18 Sinus tachycardia with PACs.  Possible LVH  Labs Pleural studies 7/19 >> negative cytology, transudative fluid Cell count-WBC 14, 87% lymphs  BAL 7/24 >> AFB smear, cultures-negative Cytology-negative  Assessment:  Recent admission for pneumonia, abnormal chest x-ray He has abnormal CT scan showing upper lobe scarring, consolidative changes with calcification,  nodular opacities, b/l effusion. CT is suggestive of old granulomatous infection with possible reactivation vs pneumonia superimposed on CHF.  There is no clear evidence of malignancy on PET scan  We will continue to monitor this.  Schedule pulmonary function test to evaluate for COPD Check labs for angiotensin-converting enzyme, rheumatoid factor, ANA, CCP Start anoro inhaler.  Bilateral effusion Transudative on thoracentesis.  Continue to monitor those  Acute on chronic diastolic heart failure, paroxysmal atrial fibrillation EKG to be reviewed today with sinus tachycardia, LVH, heart rate 115, frequent PACs Has high BNP, significant lower extremity edema and bilateral effusions on lung imaging.  He will need optimization of his heart failure   I will start him on Lasix 40 mg a day Refer to cardiology for help with management  Chronic kidney disease Review renal function on labs Will need monitoring of renal function while on Lasix.   Plan/Recommendations: - Check metabolic panel, CBC differential, IgE, proBNP, ACE, ANA, rheumatoid factor, CCP - Schedule pulmonary function test - Start Anoro inhaler - Start Lasix 40 mg a day - Cardiology referral  Marshell Garfinkel MD [facility] 07/26/2018, 9:18 AM  CC: Kerin Perna, NP  Addendum: Reviewed note from Dr. Einar Gip, [facility] cardiovascular  Clinic note 07/31/2018- Noted to have atrial arrhythmia, suspected from underlying lung condition.  Changed from amlodipine to diltiazem.  Continue amlodipine stopped and started on diltiazem.  Continue metoprolol  Cardiac stress test 08/19/2018 Perfusion abnormalities in basal inferior, mid inferolateral mid anterolateral consistent with ischemia.  LVEF calculated to be 35% but visually appears to be normal Intermediate risk study.  Clinic note 08/28/2018-  nuclear study is indeterminate.  Continue medical management.  Check lipid profile, start statin  therapy. Noted to have severe left leg pain.  Obtaining lower extremity arterial duplex. Added clonidine for uncontrolled hypertension.  Marshell Garfinkel MD [facility] 09/13/2018, 9:32 AM

## 2018-07-26 NOTE — Patient Instructions (Signed)
We will check an EKG today, comprehensive metabolic panel, CBC with differential, IgE, proBNP We will also check angiotensin-converting enzyme, ANA, rheumatoid factor, CCP  We will schedule you for pulmonary function tests and start you on anoro inhaler We will start you on Lasix 40 mg a day for heart failure and refer you to [facility] cardiovascular  Follow-up in 1 to 2 months.

## 2018-07-29 LAB — ANGIOTENSIN CONVERTING ENZYME: Angiotensin-Converting Enzyme: 107 U/L — ABNORMAL HIGH (ref 9–67)

## 2018-07-29 LAB — CYCLIC CITRUL PEPTIDE ANTIBODY, IGG

## 2018-07-29 LAB — RHEUMATOID FACTOR

## 2018-07-29 LAB — IGE: IGE (IMMUNOGLOBULIN E), SERUM: 1694 kU/L — AB (ref <=114)

## 2018-07-31 ENCOUNTER — Encounter: Payer: Self-pay | Admitting: Cardiology

## 2018-07-31 LAB — FUNGUS CULTURE WITH STAIN

## 2018-07-31 LAB — FUNGUS CULTURE RESULT

## 2018-07-31 LAB — FUNGAL ORGANISM REFLEX

## 2018-08-14 LAB — ACID FAST CULTURE WITH REFLEXED SENSITIVITIES: ACID FAST CULTURE - AFSCU3: NEGATIVE

## 2018-09-25 ENCOUNTER — Ambulatory Visit (INDEPENDENT_AMBULATORY_CARE_PROVIDER_SITE_OTHER): Payer: BLUE CROSS/BLUE SHIELD | Admitting: Pulmonary Disease

## 2018-09-25 ENCOUNTER — Encounter: Payer: Self-pay | Admitting: Pulmonary Disease

## 2018-09-25 DIAGNOSIS — J849 Interstitial pulmonary disease, unspecified: Secondary | ICD-10-CM

## 2018-09-25 DIAGNOSIS — R0602 Shortness of breath: Secondary | ICD-10-CM | POA: Diagnosis not present

## 2018-09-25 LAB — PULMONARY FUNCTION TEST
DL/VA % PRED: 67 %
DL/VA: 2.46 ml/min/mmHg/L
DLCO COR % PRED: 48 %
DLCO COR: 8.55 ml/min/mmHg
DLCO UNC % PRED: 41 %
DLCO unc: 7.33 ml/min/mmHg
FEF 25-75 Post: 1.61 L/sec
FEF 25-75 Pre: 1.22 L/sec
FEF2575-%CHANGE-POST: 32 %
FEF2575-%PRED-POST: 90 %
FEF2575-%PRED-PRE: 68 %
FEV1-%Change-Post: 11 %
FEV1-%PRED-PRE: 81 %
FEV1-%Pred-Post: 90 %
FEV1-Post: 1.66 L
FEV1-Pre: 1.49 L
FEV1FVC-%CHANGE-POST: 2 %
FEV1FVC-%Pred-Pre: 94 %
FEV6-%Change-Post: 8 %
FEV6-%PRED-PRE: 88 %
FEV6-%Pred-Post: 96 %
FEV6-POST: 2.2 L
FEV6-Pre: 2.02 L
FEV6FVC-%Change-Post: 0 %
FEV6FVC-%Pred-Post: 105 %
FEV6FVC-%Pred-Pre: 106 %
FVC-%Change-Post: 9 %
FVC-%PRED-POST: 91 %
FVC-%PRED-PRE: 83 %
FVC-POST: 2.21 L
FVC-PRE: 2.02 L
POST FEV1/FVC RATIO: 75 %
POST FEV6/FVC RATIO: 100 %
PRE FEV1/FVC RATIO: 73 %
Pre FEV6/FVC Ratio: 100 %
RV % pred: 113 %
RV: 1.95 L
TLC % pred: 84 %
TLC: 4.05 L

## 2018-09-25 MED ORDER — FLUTICASONE FUROATE-VILANTEROL 200-25 MCG/INH IN AEPB: 1.0000 | INHALATION_SPRAY | Freq: Every day | RESPIRATORY_TRACT | 6 refills | Status: AC

## 2018-09-25 NOTE — Patient Instructions (Signed)
We will start you on an inhaler called Breo 200.  Use this instead of the Anoro Will need a follow-up CT without contrast in 4 months time Follow-up in clinic after CT.

## 2018-09-25 NOTE — Progress Notes (Signed)
RAYNOR CALCATERRA    [id number]    [birth year]  Primary Care Physician:Edwards, Milford Cage, NP  Referring Physician: Kerin Perna, NP [address] [place], [place] [address]  Chief complaint: Follow-up after hospitalization for respiratory failure, chronic interstitial lung disease,  HPI: 62 year old with history of hepatitis C, hypertension, atrial fibrillation, chronic kidney disease, subdural hematoma, smoker, daily alcohol use.    Admitted to [facility] on 7/18 with weight loss, failure to thrive, cough, night sweats, dyspnea. Previous admission for anemia.  He had a CT scan at that time which showed bilateral effusions left greater than right with upper lobe nodular consolidation and PCCM consulted for help with management.  Underwent a thoracentesis showed transudative effusion.  Bronchoscopy was unrevealing with negative cultures and cytology.  After discharge he was seen again in the emergency room on 7/25 with bilateral lower extremity edema thought to be secondary to heart failure.  He also had a follow-up PET scan which showed mild uptake in the upper lobe nodular opacities which is suggestive of inflammatory process.  Pets: No pets Occupation: Former Biochemist, clinical at [employer], drove Forensic scientist for 12 years. Exposures: No known exposures, no mold, hot tub, Jacuzzi Smoking history: 40-pack-year smoker.  Quit smoking in June 2019 Travel history: No significant travel history Relevant family history: His brothers had pulmonary fibrosis of unspecified etiology.  Interim history: Follows with Dr. Einar Gip, cardiology. He had a stress test which showed intermediate risk for ischemia.  Continues on medical management for diastolic heart failure and CHF  States that breathing is up-and-down.  He has not started smoking again.  Given Anoro inhaler at last visit but he is not using it on a regular basis.  Outpatient Encounter Medications as of 09/25/2018    Medication Sig  . albuterol (PROVENTIL HFA;VENTOLIN HFA) 108 (90 Base) MCG/ACT inhaler Inhale 1 puff into the lungs every 6 (six) hours as needed for shortness of breath or wheezing.  Marland Kitchen amLODipine (NORVASC) 10 MG tablet Take 1 tablet (10 mg total) by mouth daily.  . benzonatate (TESSALON) 100 MG capsule Take 100 mg by mouth 3 (three) times daily as needed for cough.   . feeding supplement, ENSURE ENLIVE, (ENSURE ENLIVE) LIQD Take 237 mLs by mouth 3 (three) times daily between meals.  . FEROSUL 325 (65 Fe) MG tablet Take 1 tablet (325 mg total) by mouth 3 (three) times daily.  . furosemide (LASIX) 40 MG tablet Take 1 tablet (40 mg total) by mouth daily.  . hydrALAZINE (APRESOLINE) 50 MG tablet Take 1 tablet (50 mg total) by mouth every 8 (eight) hours.  . Magnesium Oxide 400 (240 Mg) MG TABS Take 400 mg by mouth daily.   . metoprolol succinate (TOPROL-XL) 100 MG 24 hr tablet Take 100 mg by mouth daily. Take with or immediately following a meal.  . Multiple Vitamin (MULTIVITAMIN WITH MINERALS) TABS tablet Take 1 tablet by mouth daily.  . pantoprazole (PROTONIX) 20 MG tablet Take 1 tablet (20 mg total) by mouth daily.  Marland Kitchen umeclidinium-vilanterol (ANORO ELLIPTA) 62.5-25 MCG/INH AEPB Inhale 1 puff into the lungs daily. (Patient not taking: Reported on 09/25/2018)   No facility-administered encounter medications on file as of 09/25/2018.    Physical Exam: Blood pressure 130/60, pulse 78, height _0  (1.499 m), weight 116 lb 6.4 oz (52.8 kg), SpO2 93 %. Gen:      No acute distress HEENT:  EOMI,  sclera anicteric Neck:     No masses; no thyromegaly Lungs:    Clear to auscultation bilaterally; normal respiratory effort CV:         Regular rate and rhythm; no murmurs Abd:      + bowel sounds; soft, non-tender; no palpable masses, no distension Ext:    No edema; adequate peripheral perfusion Skin:      Warm and dry; no rash Neuro: alert and oriented x 3 Psych: normal mood and affect  Data  Reviewed: Imaging CT ABD/Pelvis 06/09/18- bilateral moderate to large pleural effusions with dependent atelectasis, no acute abnormalities within the abdomen or pelvis, cholelithiasis, non-obstructive left nephrolithiasis  CT Chest 06/27/18- LUL and RUL consolidative nodules, large bilateral pleural effusions, mild interstitial edema in lower lobes  PET scan 02/14/6439-HKVQ hypermetabolic activity with biapical consolidation, central calcification.  Moderate activity in the sub-solid mass in the right middle lobe and right upper lobe.  Consistent with benign postinfectious or inflammatory process.  Moderate subcarinal adenopathy. I have reviewed the images personally.  Cardiac Echo 06/28/2018 Moderate LVH, EF 25-95%, grade 2 diastolic dysfunction with elevated ventricular end-diastolic filling pressure.  Six   Cardiac stress test 08/19/2018 Perfusion abnormalities in basal inferior, mid inferolateral mid anterolateral consistent with ischemia.  LVEF calculated to be 35% but visually appears to be normal Intermediate risk study.  EKG 07/19/18 Sinus tachycardia with PACs.  Possible LVH  Labs Pleural studies 7/19 >> negative cytology, transudative fluid Cell count-WBC 14, 87% lymphs  BAL 7/24 >> AFB smear, cultures-negative Cytology-negative  CBC 07/26/2018-WBC 4.7, eos 6%, absolute eosinophil count 282 IgE 07/26/2018-1694 ACE level 107, CCP negative, rheumatoid factor negative  Assessment:  Interstitial lung disease He has abnormal CT scan showing upper lobe scarring, consolidative changes with calcification, nodular opacities. CT is suggestive of old granulomatous infection, sarcoid given elevated ACE level PET scan is unremarkable. He continues to be stable we will continue to monitor Follow-up CT without contrast in 4 months   Mild asthma Likely has mild airway inflammation and reactive airway disease given elevated peripheral eosinophils and IgE PFTs reviewed with minimal obstruction,  improvement in mid flow rates post bronchodilator. Start Breo inhaler  Acute on chronic diastolic heart failure, paroxysmal atrial fibrillation Medical management per Dr. Einar Gip, cardiology   Plan/Recommendations: - Start Breo inhaler - CT without contrast in 4 months  Marshell Garfinkel MD [facility] 09/25/2018, 10:04 AM  CC: Kerin Perna, NP

## 2018-09-25 NOTE — Progress Notes (Signed)
Patient completed full PFT today. 

## 2018-10-10 ENCOUNTER — Other Ambulatory Visit: Payer: Self-pay | Admitting: Gastroenterology

## 2018-11-04 NOTE — Progress Notes (Signed)
Spoke with front desk personnel at Dr Jacinto HalimGanji office; requested LOV with Dr Jacinto HalimGanji. Personnel says she cannot locate patient in their system. She took down this RNs name and direct phone number; reports she will call if she can locate his info. RN made her aware that the request is time sensitive as patient is scheduled for procedure tomorrow

## 2018-11-04 NOTE — Progress Notes (Signed)
Received call back from staff at Dr Jacinto HalimGanji office. Staff informed RN that Mr Joseph Nash is deceased as of 05-Jun-2018. RN contacted Dr Levora AngelBrahmbhatt office and spoke with Dineta. Informed Dineta of Mr Heming's passing; she explained that they were not aware that he had passed. Dineta states that she will cancel his case and make Dr Levora AngelBrahmbhatt aware.

## 2018-11-05 ENCOUNTER — Ambulatory Visit (HOSPITAL_COMMUNITY)
Admission: RE | Admit: 2018-11-05 | Payer: BLUE CROSS/BLUE SHIELD | Source: Ambulatory Visit | Admitting: Gastroenterology

## 2018-11-05 SURGERY — COLONOSCOPY WITH PROPOFOL
Anesthesia: Monitor Anesthesia Care

## 2018-11-08 IMAGING — PT NM PET TUM IMG INITIAL (PI) SKULL BASE T - THIGH
1 of 9 series · 1 of 25 positions shown · non-contrast
Comparison: None.

CLINICAL DATA: Initial treatment strategy for lung nodule.

EXAM:
NUCLEAR MEDICINE PET SKULL BASE TO THIGH
TECHNIQUE: 5.89 mCi F-18 FDG was injected intravenously. Full-ring PET imaging
was performed from the skull base to thigh after the radiotracer. CT
data was obtained and used for attenuation correction and anatomic
localization.
Fasting blood glucose: 97 mg/dl

[Series 4: ct sk_thigh 5.0 b31f · axial · 5.0mm · 0.80mm/px · 1 of 216 slices shown]
[im 216/216  brain]
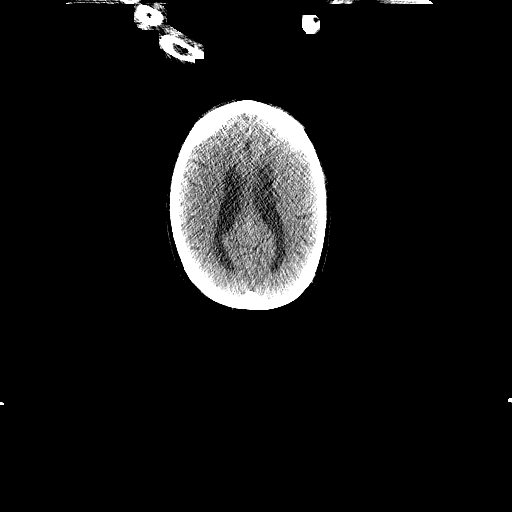

[1 of 25 positions shown; findings below may reference images not displayed]

FINDINGS: Mediastinal blood pool activity: SUV max

Of note there is poor renal clearance of radiotracer due to renal
insufficiency (recent creatinine 2.2). This result in increased
background FDG activity but is not felt to interfere with the
overall interpretation/accuracy of the scan.

NECK: No hypermetabolic lymph nodes in the neck.

Incidental CT findings: none

CHEST: There is bilateral apical consolidated masses with central
calcifications measuring approximately 3 cm each in LEFT and RIGHT
lung apex. These have mild metabolic activity for size with SUV max
equal 2.5 on the RIGHT and 1.5 on the LEFT.

A focus of consolidation and ground-glass opacity in the RIGHT
middle lobe with SUV max equal 3.3. this is less dense than the
apical masses. This is new from prior.

Likewise increase in size of consolidative sub solid 2.1 cm nodule
in the RIGHT upper lobe increased from 1.2 cm. This favors a benign
post infectious or inflammatory process

Large bilateral pleural effusions.  LEFT lower lobe atelectasis.

Moderate metabolic activity associated with enlarged subcarinal
lymph nodes with SUV max equal 3.9 and measuring 1.8 cm short axis.

Incidental CT findings: none

ABDOMEN/PELVIS: Mild activity associated with retrocrural nodes with
SUV max equal 4.1. No abnormal metabolic activity liver. No
hypermetabolic lymph nodes in the pelvis.

Adrenal glands are normal.

Incidental CT findings: none

SKELETON: No focal hypermetabolic activity to suggest skeletal
metastasis.

Incidental CT findings: none
IMPRESSION: 1. Mild metabolic activity associated with biapical consolidation
with central calcifications is favored benign post inflammatory or
infectious process.
2. Moderate activity associated with sub solid mass in the RIGHT
middle lobe and RIGHT upper nodule is favored a benign post
infectious or inflammatory process. This finding is new from CT
06/27/2018 which favors a post infectious or inflammatory process.
3. Moderate metabolic activity associated with subcarinal adenopathy
is favored reactive.
4. Recommend clinical correlation for progressive infectious /
inflammatory pulmonary process.

These results will be called to the ordering clinician or
representative by the Radiologist Assistant, and communication
documented in the PACS or zVision Dashboard.

## 2018-11-10 DEATH — deceased
